# Patient Record
Sex: Male | Born: 1950 | Race: White | Hispanic: No | Marital: Married | Smoking: Never smoker
Health system: Southern US, Community
[De-identification: ages and names within clinical notes are randomized; demographics above are authoritative.]

## PROBLEM LIST (undated history)

## (undated) DIAGNOSIS — F32A Depression, unspecified: Secondary | ICD-10-CM

## (undated) HISTORY — PX: TONSILLECTOMY: SUR1361

## (undated) HISTORY — DX: Depression, unspecified: F32.A

## (undated) HISTORY — PX: OTHER SURGICAL HISTORY: SHX169

---

## 2009-05-05 ENCOUNTER — Emergency Department (HOSPITAL_BASED_OUTPATIENT_CLINIC_OR_DEPARTMENT_OTHER): Admission: EM | Admit: 2009-05-05 | Discharge: 2009-05-05 | Payer: Self-pay | Admitting: Emergency Medicine

## 2009-05-05 ENCOUNTER — Ambulatory Visit: Payer: Self-pay | Admitting: Diagnostic Radiology

## 2011-03-04 IMAGING — CR DG CHEST 2V
2 series · 2 of 2 positions shown · non-contrast
Comparison: None

CLINICAL DATA: Cough.

CHEST - 2 VIEW

[w chest pa]
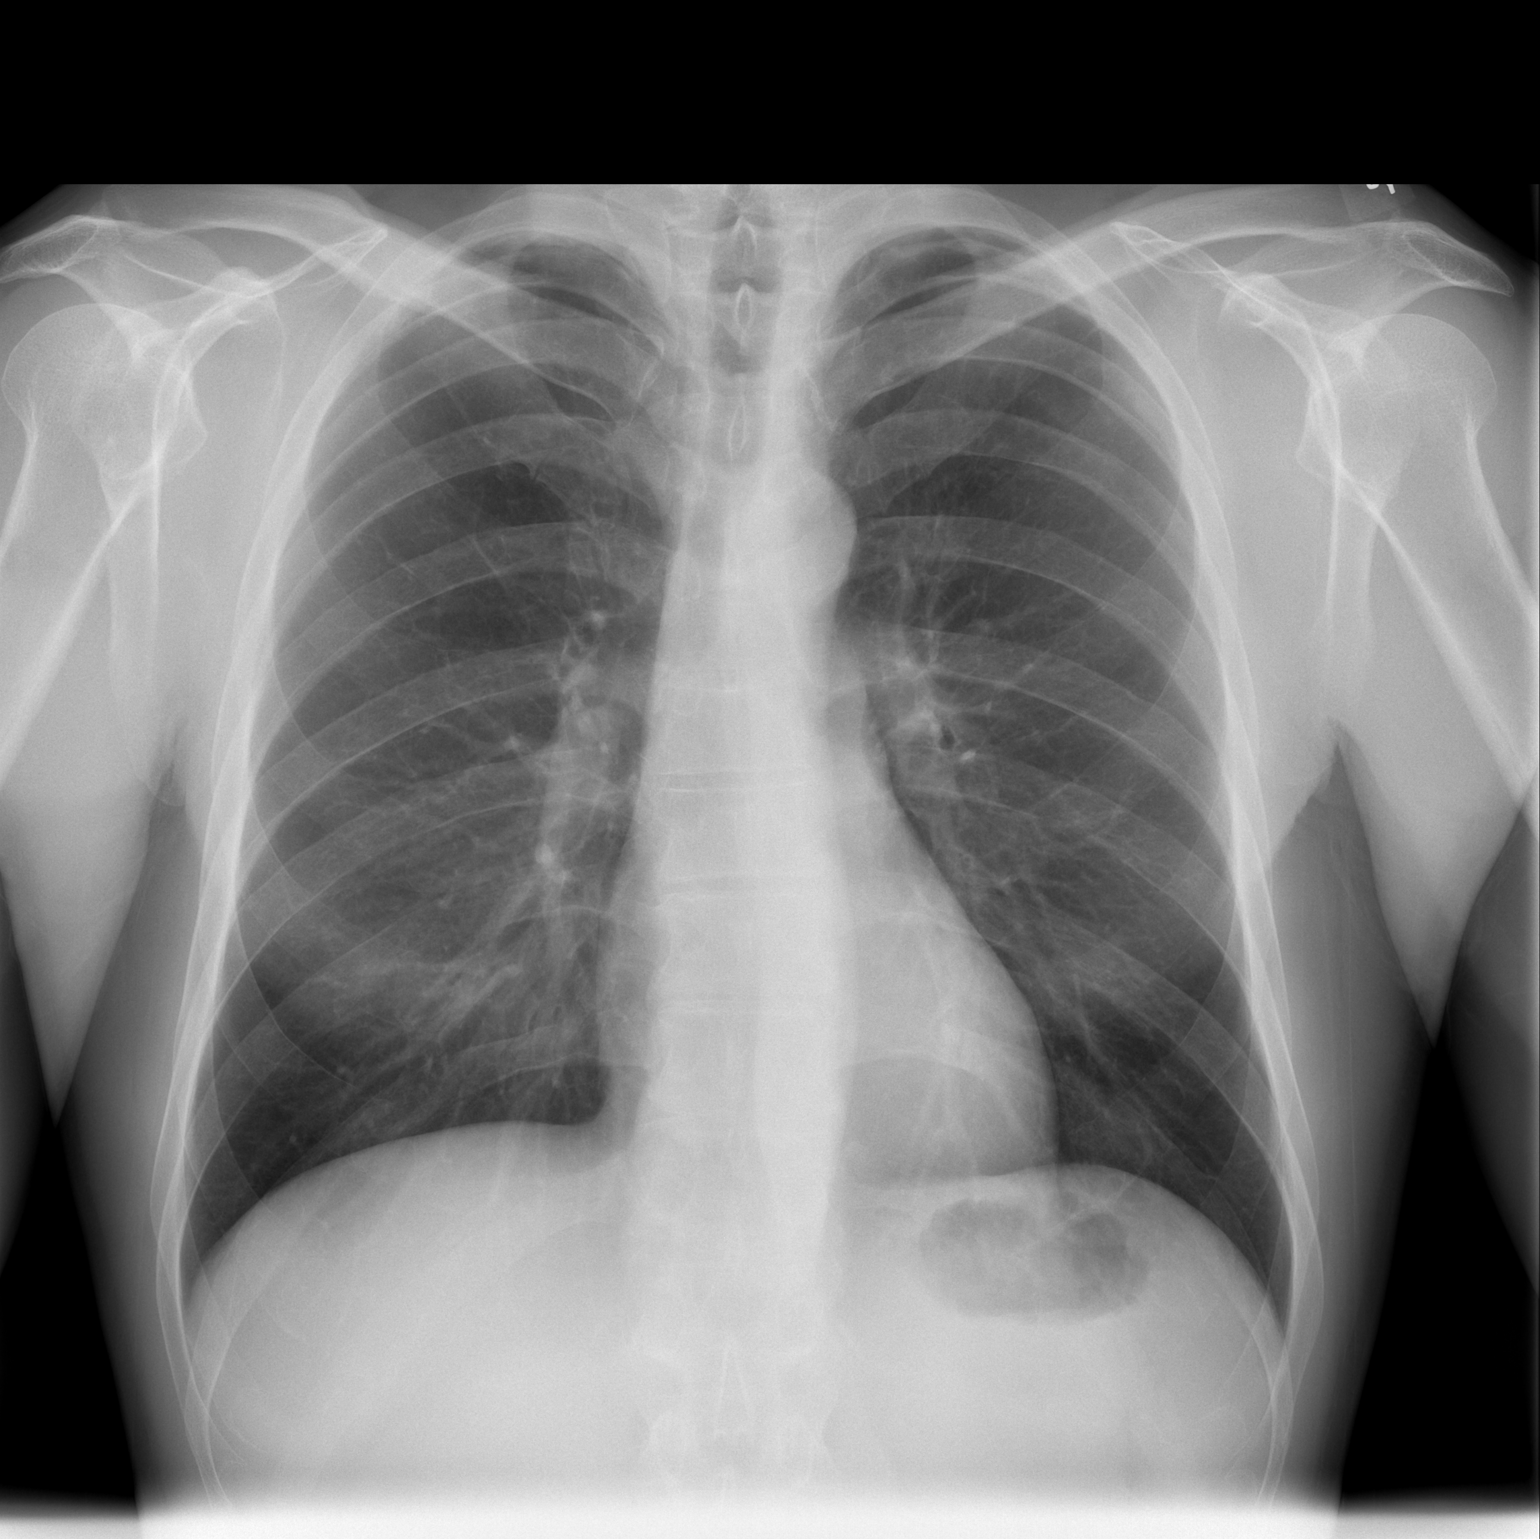

[w chest lat]
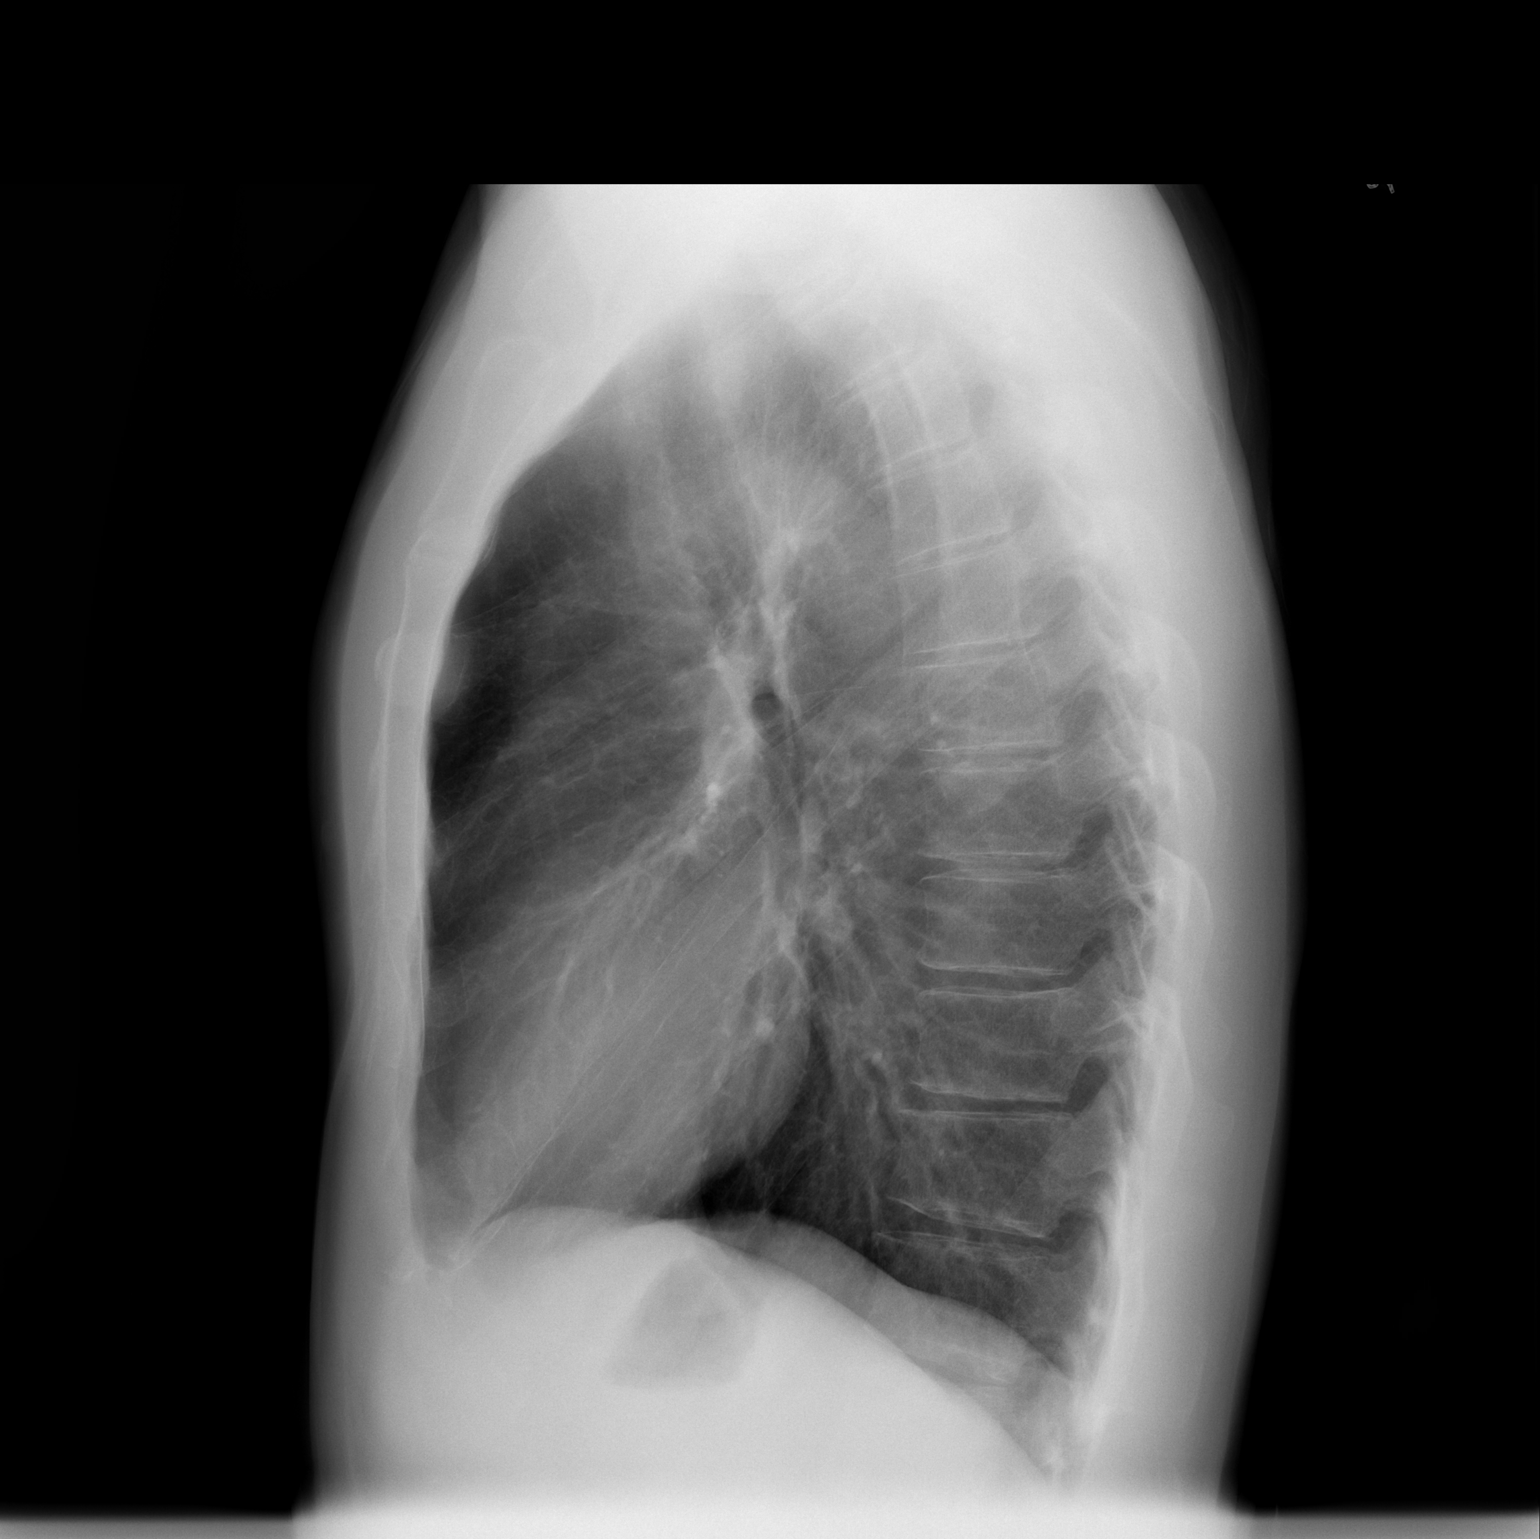

[2 of 2 positions shown; findings below may reference images not displayed]

FINDINGS: Cardiac and mediastinal contours appear normal.

The lungs appear clear.

No pleural effusion is identified.
IMPRESSION: No significant abnormality identified.

## 2015-09-21 DIAGNOSIS — I6521 Occlusion and stenosis of right carotid artery: Secondary | ICD-10-CM | POA: Insufficient documentation

## 2015-09-21 DIAGNOSIS — F325 Major depressive disorder, single episode, in full remission: Secondary | ICD-10-CM | POA: Insufficient documentation

## 2015-09-21 DIAGNOSIS — N138 Other obstructive and reflux uropathy: Secondary | ICD-10-CM | POA: Insufficient documentation

## 2015-09-21 DIAGNOSIS — E785 Hyperlipidemia, unspecified: Secondary | ICD-10-CM | POA: Insufficient documentation

## 2015-09-21 DIAGNOSIS — N401 Enlarged prostate with lower urinary tract symptoms: Secondary | ICD-10-CM | POA: Insufficient documentation

## 2015-09-21 HISTORY — DX: Occlusion and stenosis of right carotid artery: I65.21

## 2015-09-21 HISTORY — DX: Major depressive disorder, single episode, in full remission: F32.5

## 2015-09-21 HISTORY — DX: Hyperlipidemia, unspecified: E78.5

## 2015-09-21 HISTORY — DX: Benign prostatic hyperplasia with lower urinary tract symptoms: N13.8

## 2017-11-10 DIAGNOSIS — I340 Nonrheumatic mitral (valve) insufficiency: Secondary | ICD-10-CM | POA: Insufficient documentation

## 2017-11-10 HISTORY — DX: Nonrheumatic mitral (valve) insufficiency: I34.0

## 2019-03-25 ENCOUNTER — Ambulatory Visit (INDEPENDENT_AMBULATORY_CARE_PROVIDER_SITE_OTHER): Payer: Medicare Other | Admitting: Psychiatry

## 2019-03-25 ENCOUNTER — Encounter: Payer: Self-pay | Admitting: Psychiatry

## 2019-03-25 VITALS — Ht 70.0 in | Wt 170.0 lb

## 2019-03-25 DIAGNOSIS — F339 Major depressive disorder, recurrent, unspecified: Secondary | ICD-10-CM | POA: Diagnosis not present

## 2019-03-25 DIAGNOSIS — F419 Anxiety disorder, unspecified: Secondary | ICD-10-CM | POA: Diagnosis not present

## 2019-03-25 NOTE — Progress Notes (Signed)
Virtual Visit via Telephone Note  I connected with Robert Lee on 03/25/19 at  9:00 AM EST by telephone and verified that I am speaking with the correct person using two identifiers.  Location: Patient: [name]: Home   I discussed the limitations, risks, security and privacy concerns of performing an evaluation and management service by telephone and the availability of in person appointments. I also discussed with the patient that there may be a patient responsible charge related to this service. The patient expressed understanding and agreed to proceed.  I discussed the assessment and treatment plan with the patient. The patient was provided an opportunity to ask questions and all were answered. The patient agreed with the plan and demonstrated an understanding of the instructions.   The patient was advised to call back or seek an in-person evaluation if the symptoms worsen or if the condition fails to improve as anticipated.  I provided 70 minutes of non-face-to-face time during this encounter.   Robert Lee, PMHNP   [facility] MD/PA/NP Initial Note  03/26/2019 8:33 AM Robert Lee  MRN:  [id number]  Chief Complaint:  Chief Complaint    Depression; Insomnia      HPI: Pt is a 68 yo male being seen for initial evaluation for depression and anxiety. Pt reports some recent recurrence of depressive s/s after depression has been in partial remission for approximately 10 years. He reports that about 11 years ago, "I woke up one morning and literally could not get out of bed." He then sought tx from his PCP and was referred to Dr. Delanna AhmadiAlex Lee for psychiatric management. He was tx'd for depression. Pt reports then identified that he has likely has had depression throughout his lifetime. His psychiatrist started him on Wellbutrin XL and this was helpful for his depression and ADD. Pt reports, "I never felt better in my life." Pt reports that he then began to  understand responses to events in the past and his reactions.   He reports that he had severe depression with very low energy and motivation. He reports that he was socially withdrawn and had significantly lower appetite. He reports that others noticed a significant change and bank tellers and barber asked him what was wrong.   He reports that he and his wife have noticed some recent worsening in depressive s/s. About a month ago he had 4-5 consecutive days of depressive s/s with low energy and motivation, decreased appetite, and was more withdrawn. He was sleeping significantly more and would sleep for 11-12 hours. He reports that when he is depressed he loses interest in things that he is typically passionate about and thinks about giving up things he enjoys- downsizing home, retiring, etc which is the opposite of his usual desires. He reports that he has increased anxiety with worsening depression. Will experience some worry "even though there is nothing for me to worry about." Denies SI.   He reports that since those days he has felt "wonderful" and not had any further depressive s/s.   He reports that he likes things very neat and clean. He reports checking that the house is secure every night, particularly after someone tried to enter their home. Occasionally double checking coffee pot, etc. He reports that he is "punctual to a fault."   Reports that he had once incident where he thought he was having an MI and was seen in the ER and then referred to a cardiologist and work-up was negative for cardiac s/s  and was told that he had costochondritis. Recently experienced similar s/s when he had 4-5 days of depressive s/s.   Denies any past manic s/s. He denies any periods of decreased need for sleep. Denies periods of elevated mood.   Denies AH or VH. Denies paranoia.   Reports that he had a head injury in high school and had surgery.  Born and raised in [place]. Has one older brother who now lives in  [place]. He reports, "we were a very dysfunctional family." Reports that father owned and operated saloons and was a heavy drinker. Father worked long hours. Reports that mother was a full-time Materials engineer. Mother hospitalized 2-3 times for psychiatric reasons. Reports that mother was "an incredible mother" and made sure pt and his brother were well cared for. Married x 42 years. Has a son who is 27 yo and married and has 2 grandchildren, that are 34 and college aged. Has a daughter that lives in [place] and is married and has a Sport and exercise psychologist. 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 Enjoys outdoor activities, horseback riding, and yard work. He reports that his faith is important to him.   Past Psychiatric Medication Trials: Wellbutrin XL- Significant improvement Reports taking another anti-depressant that was not effective and did not have any side effects  Visit Diagnosis:    ICD-10-CM   1. Major depression, recurrent, chronic (HCC)  F33.9   2. Anxiety disorder, unspecified type  F41.9     Past Psychiatric History: Saw Dr. Angela Lee starting 11 years ago. PCP is Dr. Sallyanne Lee.   Past Medical History: History reviewed. No pertinent past medical history.  Past Surgical History:  Procedure Laterality Date  . TONSILLECTOMY      Family Psychiatric History: Mother had depression.   Family History:  Family History  Problem Relation Age of Onset  . Depression Mother   . Stroke Father   . ADD / ADHD Son   . OCD Daughter     Social History:  Social History   Socioeconomic History  . Marital status: Married    Spouse name: Not on file  . Number of children: Not on file  . Years of education: Not on file  . Highest education level: Not on file  Occupational History  . Not on file  Tobacco Use  . Smoking status: Never Smoker  . Smokeless tobacco: Never Used  Substance and Sexual Activity  . Alcohol use: Not Currently    Comment: No use in over 20-30 years  . Drug use:  Never  . Sexual activity: Not on file  Other Topics Concern  . Not on file  Social History Narrative  . Not on file   Social Determinants of Health   Financial Resource Strain:   . Difficulty of Paying Living Expenses: Not on file  Food Insecurity:   . Worried About Charity fundraiser in the Last Year: Not on file  . Ran Out of Food in the Last Year: Not on file  Transportation Needs:   . Lack of Transportation (Medical): Not on file  . Lack of Transportation (Non-Medical): Not on file  Physical Activity:   . Days of Exercise per Week: Not on file  . Minutes of Exercise per Session: Not on file  Stress:   . Feeling of Stress : Not on file  Social Connections:   . Frequency of Communication with Friends and Family: Not on file  . Frequency of Social Gatherings with Friends and Family: Not  on file  . Attends Religious Services: Not on file  . Active Member of Clubs or Organizations: Not on file  . Attends Banker Meetings: Not on file  . Marital Status: Not on file    Allergies: No Known Allergies  Metabolic Disorder Labs: No results found for: HGBA1C, MPG No results found for: PROLACTIN No results found for: CHOL, TRIG, HDL, CHOLHDL, VLDL, LDLCALC No results found for: TSH  Therapeutic Level Labs: No results found for: LITHIUM No results found for: VALPROATE No components found for:  CBMZ  Current Medications: Current Outpatient Medications  Medication Sig Dispense Refill  . buPROPion (WELLBUTRIN XL) 300 MG 24 hr tablet TAKE 1 TABLET BY MOUTH  DAILY    . rosuvastatin (CRESTOR) 10 MG tablet Take by mouth.    Marland Kitchen aspirin 81 MG EC tablet Take by mouth.    . Cholecalciferol 25 MCG (1000 UT) tablet Take by mouth.    . Omega-3 1000 MG CAPS Take by mouth.     No current facility-administered medications for this visit.    Medication Side Effects: none  Orders placed this visit:  No orders of the defined types were placed in this  encounter.   Psychiatric Specialty Exam:  Review of Systems  Constitutional: Negative.   HENT: Negative.   Eyes: Negative.   Respiratory: Negative.   Cardiovascular: Negative.   Gastrointestinal: Negative.   Endocrine: Negative.   Genitourinary: Negative.   Musculoskeletal: Positive for back pain.       Reports back pain with prolonged periods of sitting  Skin: Negative.   Allergic/Immunologic: Negative.   Neurological: Negative.   Hematological: Negative.   Psychiatric/Behavioral:       Please refer to HPI    Height  (1.778 m), weight 170 lb (77.1 kg).Body mass index is 24.39 kg/m.  General Appearance: Unable to Assess  Eye Contact:  Unable to Assess  Speech:  Clear and Coherent, Normal Rate and Talkative  Volume:  Normal  Mood:  Euthymic  Affect:  Unable to Assess  Thought Process:  Coherent, Linear and Descriptions of Associations: Intact  Orientation:  Full (Time, Place, and Person)  Thought Content: Logical and Hallucinations: None   Suicidal Thoughts:  No  Homicidal Thoughts:  No  Memory:  WNL  Judgement:  Intact  Insight:  Good  Psychomotor Activity:  Normal  Concentration:  Concentration: Good and Attention Span: Good  Recall:  Good  Fund of Knowledge: Good  Language: Good  Assets:  Communication Skills Desire for Improvement Resilience Social Support  ADL's:  Intact  Cognition: WNL  Prognosis:  Good   Receiving Psychotherapy: No   Treatment Plan/Recommendations: Pt seen for 70 minutes and greater than 50% of session spent counseling pt and coordination of care re: depression and possible treatment options, including potential benefits, risks, and side effects of increasing Wellbutrin XL to 450 mg po qd. Will request records from PCP re: past medication trials since pt is unable to recall past med trials. Answered pt's questions re: pharmacogenetic testing and discussed potential benefits of pharmacogenetic testing. Pt consents to pharmacogenetic  testing and scheduled nurse visit for pt to collect saliva sample. Discussed continuing current medication and following closely for any s/s of worsening depressive s/s while awaiting results of pharmacogenetic testing and past medical records. Pt to f/u in 4 weeks or sooner if clinically indicated. Patient advised to contact office with any questions, adverse effects, or acute worsening in signs and symptoms.   Robert Chiquito,  PMHNP

## 2019-04-29 ENCOUNTER — Encounter: Payer: Self-pay | Admitting: Psychiatry

## 2019-04-29 ENCOUNTER — Ambulatory Visit (INDEPENDENT_AMBULATORY_CARE_PROVIDER_SITE_OTHER): Payer: Medicare Other | Admitting: Psychiatry

## 2019-04-29 DIAGNOSIS — F339 Major depressive disorder, recurrent, unspecified: Secondary | ICD-10-CM | POA: Diagnosis not present

## 2019-04-29 DIAGNOSIS — F419 Anxiety disorder, unspecified: Secondary | ICD-10-CM

## 2019-04-29 NOTE — Progress Notes (Signed)
Robert Lee [id number] [birth year] 69 y.o.  Virtual Visit via Telephone Note  I connected with pt on 04/29/19 at  8:30 AM EST by telephone and verified that I am speaking with the correct person using two identifiers.   I discussed the limitations, risks, security and privacy concerns of performing an evaluation and management service by telephone and the availability of in person appointments. I also discussed with the patient that there may be a patient responsible charge related to this service. The patient expressed understanding and agreed to proceed.   I discussed the assessment and treatment plan with the patient. The patient was provided an opportunity to ask questions and all were answered. The patient agreed with the plan and demonstrated an understanding of the instructions.   The patient was advised to call back or seek an in-person evaluation if the symptoms worsen or if the condition fails to improve as anticipated.  I provided 40 minutes of non-face-to-face time during this encounter.  The patient was located at home.  The provider was located at [facility].   Corie Chiquito, PMHNP   Subjective:   Patient ID:  Robert Lee is a 69 y.o. (DOB Aug 17, 1950) male.  Chief Complaint:  Chief Complaint  Patient presents with  . Follow-up    h/o Depression and anxiety.     HPI Robert Lee presents for follow-up of depression. He reports that he has been doing well. He denies any recent depression s/s. He reports that his mood has been stable. He reports that his appetite has been good. He reports that he has been sleeping well with the exception of when he is traveling. He reports that his motivation and energy have been good overall. He reports that his motivation is lower for exercise. He reports that his concentration is consistent with his baseline. He reports that he tends to hyper-focus with his work and then have difficulty with concentration in other  areas. Denies SI.   He reports that last depressive episode was what he would consider severe. He reports that during that period he was less busy during the pandemic and that he may have been having more anxious thoughts. He notices that idle time may exacerbate his mood s/s.   Had a nice visit from daughter and grandson and then went on 4-5 day business trip.   Past Psychiatric Medication Trials: Wellbutrin XL- Significant improvement Reports taking another anti-depressant that was not effective and did not have any side effects  Review of Systems:  Review of Systems  Constitutional: Negative.   HENT: Negative.   Eyes: Negative.   Respiratory: Negative.   Cardiovascular: Negative.   Gastrointestinal: Negative.   Endocrine: Negative.   Genitourinary: Negative.   Musculoskeletal: Positive for back pain.       Reports back pain with prolonged periods of sitting  Skin: Negative.   Allergic/Immunologic: Negative.   Neurological: Negative.   Hematological: Negative.   Psychiatric/Behavioral:       Please refer to HPI    Medications: I have reviewed the patient's current medications.  Current Outpatient Medications  Medication Sig Dispense Refill  . aspirin 81 MG EC tablet Take by mouth.    Marland Kitchen buPROPion (WELLBUTRIN XL) 300 MG 24 hr tablet TAKE 1 TABLET BY MOUTH  DAILY    . Cholecalciferol 25 MCG (1000 UT) tablet Take by mouth.    . Omega-3 1000 MG CAPS Take by mouth.    . rosuvastatin (CRESTOR) 10 MG tablet Take by  mouth.     No current facility-administered medications for this visit.    Medication Side Effects: None  Allergies: No Known Allergies  History reviewed. No pertinent past medical history.  Family History  Problem Relation Age of Onset  . Depression Mother   . Stroke Father   . ADD / ADHD Son   . OCD Daughter     Social History   Socioeconomic History  . Marital status: Married    Spouse name: Not on file  . Number of children: Not on file  . Years  of education: Not on file  . Highest education level: Not on file  Occupational History  . Not on file  Tobacco Use  . Smoking status: Never Smoker  . Smokeless tobacco: Never Used  Substance and Sexual Activity  . Alcohol use: Not Currently    Comment: No use in over 20-30 years  . Drug use: Never  . Sexual activity: Not on file  Other Topics Concern  . Not on file  Social History Narrative  . Not on file   Social Determinants of Health   Financial Resource Strain:   . Difficulty of Paying Living Expenses: Not on file  Food Insecurity:   . Worried About Programme researcher, broadcasting/film/video in the Last Year: Not on file  . Ran Out of Food in the Last Year: Not on file  Transportation Needs:   . Lack of Transportation (Medical): Not on file  . Lack of Transportation (Non-Medical): Not on file  Physical Activity:   . Days of Exercise per Week: Not on file  . Minutes of Exercise per Session: Not on file  Stress:   . Feeling of Stress : Not on file  Social Connections:   . Frequency of Communication with Friends and Family: Not on file  . Frequency of Social Gatherings with Friends and Family: Not on file  . Attends Religious Services: Not on file  . Active Member of Clubs or Organizations: Not on file  . Attends Banker Meetings: Not on file  . Marital Status: Not on file  Intimate Partner Violence:   . Fear of Current or Ex-Partner: Not on file  . Emotionally Abused: Not on file  . Physically Abused: Not on file  . Sexually Abused: Not on file    Past Medical History, Surgical history, Social history, and Family history were reviewed and updated as appropriate.   Please see review of systems for further details on the patient's review from today.   Objective:   Physical Exam:  There were no vitals taken for this visit.  Physical Exam Neurological:     Mental Status: He is alert and oriented to person, place, and time.     Cranial Nerves: No dysarthria.   Psychiatric:        Attention and Perception: Attention and perception normal.        Mood and Affect: Mood normal.        Speech: Speech normal.        Behavior: Behavior is cooperative.        Thought Content: Thought content normal. Thought content is not paranoid or delusional. Thought content does not include homicidal or suicidal ideation. Thought content does not include homicidal or suicidal plan.        Cognition and Memory: Cognition and memory normal.        Judgment: Judgment normal.     Comments: Insight intact     Lab Review:  No results found for: NA, K, CL, CO2, GLUCOSE, BUN, CREATININE, CALCIUM, PROT, ALBUMIN, AST, ALT, ALKPHOS, BILITOT, GFRNONAA, GFRAA  No results found for: WBC, RBC, HGB, HCT, PLT, MCV, MCH, MCHC, RDW, LYMPHSABS, MONOABS, EOSABS, BASOSABS  No results found for: POCLITH, LITHIUM   No results found for: PHENYTOIN, PHENOBARB, VALPROATE, CBMZ   .res Assessment: Plan:   Patient seen for 40 minutes and time spent reviewing results of pharmacogenetics testing and discussing possible implications for treatment.  Discussed that testing indicates that he may be more responsive to a medication that does not rely on the serotonin transporter gene, and therefore may respond well to an SNRI such as Cymbalta.  Discussed potential benefits, risks, and side effects of Cymbalta.  Discussed option of starting Cymbalta at this time or continuing Wellbutrin XL since he has not had any subsequent depressive episodes, and then considering adding Cymbalta if he experienced a recurrence of depressive signs and symptoms.  Patient reports that he would prefer to continue Wellbutrin XL at this time, and will contact provider if he notices any recurrence of depressive signs and symptoms. Will follow-up in 3 months. Patient advised to contact office with any questions, adverse effects, or acute worsening in signs and symptoms.  Robert Lee was seen today for follow-up.  Diagnoses and  all orders for this visit:  Major depression, recurrent, chronic (HCC)  Anxiety disorder, unspecified type    Please see After Visit Summary for patient specific instructions.  No future appointments.  No orders of the defined types were placed in this encounter.     -------------------------------

## 2019-08-25 ENCOUNTER — Telehealth: Payer: Self-pay | Admitting: Psychiatry

## 2019-08-25 DIAGNOSIS — F419 Anxiety disorder, unspecified: Secondary | ICD-10-CM

## 2019-08-25 DIAGNOSIS — F339 Major depressive disorder, recurrent, unspecified: Secondary | ICD-10-CM

## 2019-08-25 MED ORDER — DULOXETINE HCL 30 MG PO CPEP: ORAL_CAPSULE | ORAL | 0 refills | Status: DC

## 2019-08-25 MED ORDER — DULOXETINE HCL 60 MG PO CPEP: 60.0000 mg | ORAL_CAPSULE | Freq: Every day | ORAL | 0 refills | Status: DC

## 2019-08-25 NOTE — Telephone Encounter (Addendum)
He reports, "today I am doing great, but over the last few weeks I have been struggling." He has been experiencing some anxiety and depression. He reports that "blue days are becoming more frequent." He reports that depressive s/s have been more intense. He reports feeling nervous and worried. He has been thinking about everything in his home that needs to be tended to. He has started re-living his past regrets. Energy and motivation have been low. He reports that he has had difficulty doing smaller tasks that he normally would not have difficulty completing. He reports that he goes to sleep around 9 pm and sleeping until 6 am. Has gone to bed around 7 pm. He reports that appetite has been decreased and has been eating only one meal a day. Denies SI.   Plan: Start cymbalta 30 mg po q am x 1 week, then increase to 60 mg po q am for depression and anxiety. Discussed potential benefits, risks, and side effects of Cymbalta and pt agrees to trial. Pt to f/u in 5 weeks or sooner if clinically indicated. Patient advised to contact office with any questions, adverse effects, or acute worsening in signs and symptoms.

## 2019-08-25 NOTE — Telephone Encounter (Signed)
Pt is requesting a call from [employer]. He did give the reason, he would just like a call back on # [phone]

## 2019-09-26 ENCOUNTER — Encounter: Payer: Self-pay | Admitting: Psychiatry

## 2019-09-26 ENCOUNTER — Ambulatory Visit (INDEPENDENT_AMBULATORY_CARE_PROVIDER_SITE_OTHER): Payer: Medicare Other | Admitting: Psychiatry

## 2019-09-26 ENCOUNTER — Other Ambulatory Visit: Payer: Self-pay

## 2019-09-26 DIAGNOSIS — F419 Anxiety disorder, unspecified: Secondary | ICD-10-CM | POA: Diagnosis not present

## 2019-09-26 DIAGNOSIS — F339 Major depressive disorder, recurrent, unspecified: Secondary | ICD-10-CM | POA: Diagnosis not present

## 2019-09-26 MED ORDER — DULOXETINE HCL 60 MG PO CPEP: 60.0000 mg | ORAL_CAPSULE | Freq: Every day | ORAL | 1 refills | Status: DC

## 2019-09-26 NOTE — Progress Notes (Signed)
Robert Lee [id number] 1950-07-06 69 y.o.  Subjective:   Patient ID:  Robert Lee is a 69 y.o. (DOB 1951/02/25) male.  Chief Complaint:  Chief Complaint  Patient presents with  . Follow-up    Depression, anxiety    HPI Robert Lee presents to the office today for follow-up of depression and anxiety.  He reports that he has been "wonderful" after starting Cymbalta- "back to my old self, have a lot of energy, want to be around people, not anxious." Reports less rumination. Denies current depression. Denies irritability. Concentration has been adequate. Sleeping well. Estimates sleeping about 8-10 hours a night. He reports that his motivation has been good. Denies impulsive or risky behavior. Denies excessive energy or increased goal-directed activity. Denies anhedonia. Denies SI.   He reports that he is traveling to [place] weekly on business. 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 Works out of his home and is self-employed.   He reports that his wife reports that he has been doing well. Reports that wife is supportive.   Past Psychiatric Medication Trials: Wellbutrin XL- Significant improvement Reports taking another anti-depressant that was not effective and did not have any side effects  Review of Systems:  Review of Systems  HENT: Positive for hearing loss.   Musculoskeletal: Positive for arthralgias. Negative for gait problem.  Neurological: Negative for tremors.  Psychiatric/Behavioral:       Please refer to HPI    Medications: I have reviewed the patient's current medications.  Current Outpatient Medications  Medication Sig Dispense Refill  . aspirin 81 MG EC tablet Take by mouth.    Marland Kitchen buPROPion (WELLBUTRIN XL) 300 MG 24 hr tablet TAKE 1 TABLET BY MOUTH  DAILY    . Cholecalciferol 25 MCG (1000 UT) tablet Take by mouth.    . diphenhydrAMINE (SIMPLY SLEEP) 25 MG tablet Take 25 mg by mouth at bedtime as  needed for sleep.    . DULoxetine (CYMBALTA) 60 MG capsule Take 1 capsule (60 mg total) by mouth daily. 90 capsule 1  . Omega-3 1000 MG CAPS Take by mouth.    . rosuvastatin (CRESTOR) 10 MG tablet Take by mouth.     No current facility-administered medications for this visit.    Medication Side Effects: None  Allergies: No Known Allergies  History reviewed. No pertinent past medical history.  Family History  Problem Relation Age of Onset  . Depression Mother   . Stroke Father   . ADD / ADHD Son   . OCD Daughter     Social History   Socioeconomic History  . Marital status: Married    Spouse name: Not on file  . Number of children: Not on file  . Years of education: Not on file  . Highest education level: Not on file  Occupational History  . Not on file  Tobacco Use  . Smoking status: Never Smoker  . Smokeless tobacco: Never Used  Substance and Sexual Activity  . Alcohol use: Not Currently    Comment: No use in over 20-30 years  . Drug use: Never  . Sexual activity: Not on file  Other Topics Concern  . Not on file  Social History Narrative  . Not on file   Social Determinants of Health   Financial Resource Strain:   . Difficulty of Paying Living Expenses:   Food Insecurity:   . Worried About Programme researcher, broadcasting/film/video in the Last  Year:   . Ran Out of Food in the Last Year:   Transportation Needs:   . Film/video editor (Medical):   Marland Kitchen Lack of Transportation (Non-Medical):   Physical Activity:   . Days of Exercise per Week:   . Minutes of Exercise per Session:   Stress:   . Feeling of Stress :   Social Connections:   . Frequency of Communication with Friends and Family:   . Frequency of Social Gatherings with Friends and Family:   . Attends Religious Services:   . Active Member of Clubs or Organizations:   . Attends Archivist Meetings:   Marland Kitchen Marital Status:   Intimate Partner Violence:   . Fear of Current or Ex-Partner:   . Emotionally Abused:    Marland Kitchen Physically Abused:   . Sexually Abused:     Past Medical History, Surgical history, Social history, and Family history were reviewed and updated as appropriate.   Please see review of systems for further details on the patient's review from today.   Objective:   Physical Exam:  BP (!) 143/69   Pulse 74   Wt 170 lb (77.1 kg)   BMI 24.39 kg/m   Physical Exam Constitutional:      General: He is not in acute distress. Musculoskeletal:        General: No deformity.  Neurological:     Mental Status: He is alert and oriented to person, place, and time.     Coordination: Coordination normal.  Psychiatric:        Attention and Perception: Attention and perception normal. He does not perceive auditory or visual hallucinations.        Mood and Affect: Mood normal. Mood is not anxious or depressed. Affect is not labile, blunt, angry or inappropriate.        Speech: Speech normal.        Behavior: Behavior normal.        Thought Content: Thought content normal. Thought content is not paranoid or delusional. Thought content does not include homicidal or suicidal ideation. Thought content does not include homicidal or suicidal plan.        Cognition and Memory: Cognition and memory normal.        Judgment: Judgment normal.     Comments: Insight intact     Lab Review:  No results found for: NA, K, CL, CO2, GLUCOSE, BUN, CREATININE, CALCIUM, PROT, ALBUMIN, AST, ALT, ALKPHOS, BILITOT, GFRNONAA, GFRAA  No results found for: WBC, RBC, HGB, HCT, PLT, MCV, MCH, MCHC, RDW, LYMPHSABS, MONOABS, EOSABS, BASOSABS  No results found for: POCLITH, LITHIUM   No results found for: PHENYTOIN, PHENOBARB, VALPROATE, CBMZ   .res Assessment: Plan:   Continue Cymbalta 60 mg daily for anxiety and depression since patient reports that mood and anxiety have improved with Cymbalta. Recommend continuing Wellbutrin XL for depression.  Patient reports that he does not need a refill at this time. Patient  follow-up in 6 months or sooner if clinically indicated. Patient advised to contact office with any questions, adverse effects, or acute worsening in signs and symptoms.  [name] was seen today for follow-up.  Diagnoses and all orders for this visit:  Major depression, recurrent, chronic (HCC) -     DULoxetine (CYMBALTA) 60 MG capsule; Take 1 capsule (60 mg total) by mouth daily.  Anxiety disorder, unspecified type -     DULoxetine (CYMBALTA) 60 MG capsule; Take 1 capsule (60 mg total) by mouth daily.  Please see After Visit Summary for patient specific instructions.  Future Appointments  Date Time Provider Department Center  03/27/2020  8:30 AM Corie Chiquito, PMHNP [facility] None    No orders of the defined types were placed in this encounter.   -------------------------------

## 2019-09-29 DIAGNOSIS — H906 Mixed conductive and sensorineural hearing loss, bilateral: Secondary | ICD-10-CM | POA: Insufficient documentation

## 2019-09-29 HISTORY — DX: Mixed conductive and sensorineural hearing loss, bilateral: H90.6

## 2020-03-05 ENCOUNTER — Ambulatory Visit (INDEPENDENT_AMBULATORY_CARE_PROVIDER_SITE_OTHER): Payer: Medicare Other | Admitting: Psychiatry

## 2020-03-05 ENCOUNTER — Other Ambulatory Visit: Payer: Self-pay

## 2020-03-05 ENCOUNTER — Encounter: Payer: Self-pay | Admitting: Psychiatry

## 2020-03-05 DIAGNOSIS — F419 Anxiety disorder, unspecified: Secondary | ICD-10-CM

## 2020-03-05 DIAGNOSIS — F339 Major depressive disorder, recurrent, unspecified: Secondary | ICD-10-CM | POA: Diagnosis not present

## 2020-03-05 NOTE — Progress Notes (Signed)
Robert Lee [id number] 29-Sep-1950 69 y.o.  Subjective:   Patient ID:  Robert Lee is a 69 y.o. (DOB 28-Mar-1951) male.  Chief Complaint:  Chief Complaint  Patient presents with   Follow-up    h/o depression and anxiety    HPI Robert Lee presents to the office today for follow-up of depression and anxiety. "I have no complaints." He reports that he has had "weird dreams" some nights. He reports that dreams can disrupt sleep at times. He reports that about twice a month he has very vivid dreams that cause multiple awakenings and feels that he does not rest. He reports that he had vivid dreams throughout the night last night. He reports that he went to be earlier due to being tired after having company. He reports that yesterday he forgot to take medication yesterday. Had difficulty falling asleep last night. He notices sleep is improved when he has been more physically active. He describes mood as "great." Denies depressed mood or irritability. Denies anxiety. He reports that he normally does not have difficulty falling or staying asleep. He typically will take a 30-minute nap during the day. He reports that he typically eats throughout the day. He reports that appetite has been slightly less and notices on a few occasions he has not "over-indulged" during some situations that he normally would. He has been trying to hydrate more. Energy and motivation have been good. Concentration has been adequate. Denies social isolation. Denies SI.   Working out of his home. Reports that work is going well. He enjoys his work.   Has a 40 month old grandson. Has 2 other older grandchildren.   Stopped taking Simply Sleep   Past Psychiatric Medication Trials: Wellbutrin XL- Significant improvement Cymbalta Reports taking another anti-depressant that was not effective and did not have any side effects    Review of Systems:  Review of Systems  Constitutional: Negative for diaphoresis.   Musculoskeletal: Positive for arthralgias. Negative for gait problem.  Neurological: Negative for tremors.  Psychiatric/Behavioral:       Please refer to HPI    Medications: I have reviewed the patient's current medications.  Current Outpatient Medications  Medication Sig Dispense Refill   aspirin 81 MG EC tablet Take by mouth.     buPROPion (WELLBUTRIN XL) 300 MG 24 hr tablet TAKE 1 TABLET BY MOUTH  DAILY     Cholecalciferol 25 MCG (1000 UT) tablet Take by mouth.     DULoxetine (CYMBALTA) 60 MG capsule Take 1 capsule (60 mg total) by mouth daily. 90 capsule 1   Omega-3 1000 MG CAPS Take by mouth.     rosuvastatin (CRESTOR) 10 MG tablet Take by mouth.     valACYclovir (VALTREX) 1000 MG tablet TAKE TWO TABLETS BY MOUTH AT ONSET, THEN TAKE TWO TABLETS 12 HOURS LATER AS DIRECTED     No current facility-administered medications for this visit.    Medication Side Effects: Other: Possible occ vivid dreams  Allergies: No Known Allergies  History reviewed. No pertinent past medical history.  Family History  Problem Relation Age of Onset   Depression Mother    Stroke Father    ADD / ADHD Son    OCD Daughter     Social History   Socioeconomic History   Marital status: Married    Spouse name: Not on file   Number of children: Not on file   Years of education: Not on file   Highest education level: Not on file  Occupational History   Not on file  Tobacco Use   Smoking status: Never Smoker   Smokeless tobacco: Never Used  Substance and Sexual Activity   Alcohol use: Not Currently    Comment: No use in over 20-30 years   Drug use: Never   Sexual activity: Not on file  Other Topics Concern   Not on file  Social History Narrative   Not on file   Social Determinants of Health   Financial Resource Strain:    Difficulty of Paying Living Expenses: Not on file  Food Insecurity:    Worried About Running Out of Food in the Last Year: Not on file    Ran Out of Food in the Last Year: Not on file  Transportation Needs:    Lack of Transportation (Medical): Not on file   Lack of Transportation (Non-Medical): Not on file  Physical Activity:    Days of Exercise per Week: Not on file   Minutes of Exercise per Session: Not on file  Stress:    Feeling of Stress : Not on file  Social Connections:    Frequency of Communication with Friends and Family: Not on file   Frequency of Social Gatherings with Friends and Family: Not on file   Attends Religious Services: Not on file   Active Member of Clubs or Organizations: Not on file   Attends Banker Meetings: Not on file   Marital Status: Not on file  Intimate Partner Violence:    Fear of Current or Ex-Partner: Not on file   Emotionally Abused: Not on file   Physically Abused: Not on file   Sexually Abused: Not on file    Past Medical History, Surgical history, Social history, and Family history were reviewed and updated as appropriate.   Please see review of systems for further details on the patient's review from today.   Objective:   Physical Exam:  There were no vitals taken for this visit.  Physical Exam Constitutional:      General: He is not in acute distress. Musculoskeletal:        General: No deformity.  Neurological:     Mental Status: He is alert and oriented to person, place, and time.     Coordination: Coordination normal.  Psychiatric:        Attention and Perception: Attention and perception normal. He does not perceive auditory or visual hallucinations.        Mood and Affect: Mood normal. Mood is not anxious or depressed. Affect is not labile, blunt, angry or inappropriate.        Speech: Speech normal.        Behavior: Behavior normal.        Thought Content: Thought content normal. Thought content is not paranoid or delusional. Thought content does not include homicidal or suicidal ideation. Thought content does not include homicidal  or suicidal plan.        Cognition and Memory: Cognition and memory normal.        Judgment: Judgment normal.     Comments: Insight intact     Lab Review:  No results found for: NA, K, CL, CO2, GLUCOSE, BUN, CREATININE, CALCIUM, PROT, ALBUMIN, AST, ALT, ALKPHOS, BILITOT, GFRNONAA, GFRAA  No results found for: WBC, RBC, HGB, HCT, PLT, MCV, MCH, MCHC, RDW, LYMPHSABS, MONOABS, EOSABS, BASOSABS  No results found for: POCLITH, LITHIUM   No results found for: PHENYTOIN, PHENOBARB, VALPROATE, CBMZ   .res Assessment: Plan:   Patient seen  for 30 minutes and time spent counseling patient regarding possible medication side effects and strategies to reduce vivid dreams.  Discussed that missed dose yesterday and disruptions in sleep schedule may have contributed to vivid dreams last night.  Discussed that vivid dreams can be a potential side effect with antidepressants and to notify provider if vivid dreams worsen and/o interfere with quality sleep. Continue Cymbalta 60 mg daily for depression and anxiety. Continue Wellbutrin XL 300 mg daily for depression. Patient to follow-up in 6 months or sooner if clinically indicated. Patient advised to contact office with any questions, adverse effects, or acute worsening in signs and symptoms.  Robert Lee was seen today for follow-up.  Diagnoses and all orders for this visit:  Major depression, recurrent, chronic (HCC)  Anxiety disorder, unspecified type     Please see After Visit Summary for patient specific instructions.  No future appointments.  No orders of the defined types were placed in this encounter.   -------------------------------

## 2020-03-27 ENCOUNTER — Ambulatory Visit: Payer: Medicare Other | Admitting: Psychiatry

## 2020-04-13 ENCOUNTER — Telehealth: Payer: Self-pay | Admitting: Psychiatry

## 2020-04-13 DIAGNOSIS — F419 Anxiety disorder, unspecified: Secondary | ICD-10-CM

## 2020-04-13 DIAGNOSIS — F339 Major depressive disorder, recurrent, unspecified: Secondary | ICD-10-CM

## 2020-04-13 MED ORDER — DULOXETINE HCL 60 MG PO CPEP: 60.0000 mg | ORAL_CAPSULE | Freq: Every day | ORAL | 1 refills | Status: DC

## 2020-04-13 MED ORDER — DULOXETINE HCL 60 MG PO CPEP: 60.0000 mg | ORAL_CAPSULE | Freq: Every day | ORAL | 0 refills | Status: DC

## 2020-04-13 NOTE — Telephone Encounter (Signed)
Next appt is 08/31/20. Requesting a refill for Duloxetine called in to [facility] at [phone]. Since [facility] is totally out and it will be 10 days before he gets the medicine in mail, can you please call in #10 pills to [facility] on [address], [place]. Phone# [phone]. This will get him by until he gets it from [place].

## 2020-04-13 NOTE — Telephone Encounter (Signed)
90 day script sent to [facility] and 10 day supply sent to [facility].

## 2020-07-30 ENCOUNTER — Encounter: Payer: Self-pay | Admitting: Psychiatry

## 2020-07-30 ENCOUNTER — Other Ambulatory Visit: Payer: Self-pay

## 2020-07-30 ENCOUNTER — Ambulatory Visit (INDEPENDENT_AMBULATORY_CARE_PROVIDER_SITE_OTHER): Payer: Medicare Other | Admitting: Psychiatry

## 2020-07-30 DIAGNOSIS — F339 Major depressive disorder, recurrent, unspecified: Secondary | ICD-10-CM

## 2020-07-30 DIAGNOSIS — F419 Anxiety disorder, unspecified: Secondary | ICD-10-CM

## 2020-07-30 MED ORDER — DULOXETINE HCL 60 MG PO CPEP: 60.0000 mg | ORAL_CAPSULE | Freq: Every day | ORAL | 1 refills | Status: DC

## 2020-07-30 MED ORDER — BUPROPION HCL ER (XL) 300 MG PO TB24: 1.0000 | ORAL_TABLET | Freq: Every day | ORAL | 1 refills | Status: DC

## 2020-07-30 NOTE — Progress Notes (Signed)
ESTILL LLERENA [id number] 01/15/1951 70 y.o.  Subjective:   Patient ID:  Robert Lee is a 70 y.o. (DOB June 27, 1950) male.  Chief Complaint:  Chief Complaint  Patient presents with  . Follow-up    H/o depression    HPI Robert Lee presents to the office today for follow-up of history of depression. He reports that about a month ago he had 3 consecutive days where he felt "horrible" with depressive s/s to include decreased appetite, very little energy and motivation, sleeps/dreams more with less restorative sleep, and "staring off." He reports that depressive s/s resolved after he withdrew from others.  He and his wife have someone staying with them that is going through a divorce and feeling that systems are "failing" her. He questions if this situation has negatively affected his mood and it occurred around the same time as his depression. He reports that his mood has returned to baseline. Energy and motivation have been good. He reports that he is very self-motivated. Denies concentration impairment. Sleeping well. Appetite has been good. Denies SI.   He reports that he has not been exercising regularly.   He describes himself as a perfectionist and does not want to make mistakes or let people down.   He reports that he had a depressive episode one year ago in late April/early May. Denies any anniversaries of trauma or losses.   He reports that his paternal grandmother would have "spells" where she would sit in a rocking chair and stare.   Past Psychiatric Medication Trials: Wellbutrin XL- Significant improvement Cymbalta Reports taking another anti-depressant that was not effective and did not have any side effects    Review of Systems:  Review of Systems  Gastrointestinal: Negative.   Musculoskeletal: Negative for gait problem.  Neurological: Negative for tremors and headaches.  Psychiatric/Behavioral:       Please refer to HPI    Medications: I have reviewed the  patient's current medications.  Current Outpatient Medications  Medication Sig Dispense Refill  . aspirin 81 MG EC tablet Take by mouth.    . Cholecalciferol 25 MCG (1000 UT) tablet Take by mouth.    . Omega-3 1000 MG CAPS Take by mouth.    . rosuvastatin (CRESTOR) 10 MG tablet Take by mouth.    Marland Kitchen buPROPion (WELLBUTRIN XL) 300 MG 24 hr tablet Take 1 tablet (300 mg total) by mouth daily. 90 tablet 1  . DULoxetine (CYMBALTA) 60 MG capsule Take 1 capsule (60 mg total) by mouth daily. 90 capsule 1  . valACYclovir (VALTREX) 1000 MG tablet TAKE TWO TABLETS BY MOUTH AT ONSET, THEN TAKE TWO TABLETS 12 HOURS LATER AS DIRECTED     No current facility-administered medications for this visit.    Medication Side Effects: None  Allergies: No Known Allergies  History reviewed. No pertinent past medical history.  Past Medical History, Surgical history, Social history, and Family history were reviewed and updated as appropriate.   Please see review of systems for further details on the patient's review from today.   Objective:   Physical Exam:  There were no vitals taken for this visit.  Physical Exam Constitutional:      General: He is not in acute distress. Musculoskeletal:        General: No deformity.  Neurological:     Mental Status: He is alert and oriented to person, place, and time.     Coordination: Coordination normal.  Psychiatric:        Attention and  Perception: Attention and perception normal. He does not perceive auditory or visual hallucinations.        Mood and Affect: Mood normal. Mood is not anxious or depressed. Affect is not labile, blunt, angry or inappropriate.        Speech: Speech normal.        Behavior: Behavior normal.        Thought Content: Thought content normal. Thought content is not paranoid or delusional. Thought content does not include homicidal or suicidal ideation. Thought content does not include homicidal or suicidal plan.        Cognition and  Memory: Cognition and memory normal.        Judgment: Judgment normal.     Comments: Insight intact     Lab Review:  No results found for: NA, K, CL, CO2, GLUCOSE, BUN, CREATININE, CALCIUM, PROT, ALBUMIN, AST, ALT, ALKPHOS, BILITOT, GFRNONAA, GFRAA  No results found for: WBC, RBC, HGB, HCT, PLT, MCV, MCH, MCHC, RDW, LYMPHSABS, MONOABS, EOSABS, BASOSABS  No results found for: POCLITH, LITHIUM   No results found for: PHENYTOIN, PHENOBARB, VALPROATE, CBMZ   .res Assessment: Plan:   Pt seen for 30 minutes and time spent counseling pt regarding cycles of depression. Discussed that episodes of depression are not typical in terms of abrupt onset and sudden resolution and that this type of depression sometimes responds well to very low dose lithium or lamotrigine. Discussed that one of these medications could be started if depressive episodes increase in frequency and/or duration since they are now occurring about once a year and lasting several days. Advised pt to contact office with worsening depression.  Continue Cymbalta 60 mg daily for depression. He asks about increasing Cymbalta to 2 capsules during a depressive episode. Discussed that doses higher than 60 mg are off label. Discussed trying an additional 30 mg if necessary in the future to equal total dose of 90 mg instead of taking two 60 mg capsules.  Continue Wellbutrin XL 300 mg daily for depression.  Pt to follow-up in 6 months or sooner if clinically indicated.  Patient advised to contact office with any questions, adverse effects, or acute worsening in signs and symptoms.  Robert Lee was seen today for follow-up.  Diagnoses and all orders for this visit:  Major depression, recurrent, chronic (HCC) -     DULoxetine (CYMBALTA) 60 MG capsule; Take 1 capsule (60 mg total) by mouth daily. -     buPROPion (WELLBUTRIN XL) 300 MG 24 hr tablet; Take 1 tablet (300 mg total) by mouth daily.  Anxiety disorder, unspecified type -     DULoxetine  (CYMBALTA) 60 MG capsule; Take 1 capsule (60 mg total) by mouth daily.     Please see After Visit Summary for patient specific instructions.  Future Appointments  Date Time Provider Department Center  01/29/2021  8:30 AM Corie Chiquito, PMHNP [facility] None    No orders of the defined types were placed in this encounter.   -------------------------------

## 2020-07-31 ENCOUNTER — Telehealth: Payer: Self-pay | Admitting: Internal Medicine

## 2020-07-31 NOTE — Telephone Encounter (Signed)
Patient would like to become your patient. His pcp has retired. Please advise

## 2020-08-01 NOTE — Telephone Encounter (Signed)
Yes- I think he was referred to [place] by Corie Chiquito.  I will see him

## 2020-08-01 NOTE — Telephone Encounter (Signed)
Lm for patient to call back to schedule.

## 2020-08-11 NOTE — Progress Notes (Addendum)
[facility] [address], [address] [place], [place] [address] [phone]  Date:  08/13/2020   Name:  Robert Lee   DOB:  1950-07-24   MRN:  [id number]  PCP:  Pearline Cables, MD    Chief Complaint: New Patient (Initial Visit) (Previous pcp retired)   History of Present Illness:  Robert Lee is a 70 y.o. very pleasant male patient who presents with the following:  Seen today as a new patient to establish care Referred to see [place] by Corie Chiquito at [facility]  He is still working- he is in the [employer] business Married to [place], they have 2 grown children and 3 grands ages 26, 75, 75 years old  He is from [place], has lived in this area 45 years   No history of CAD He has done a couple of stress tests in the past due to chest pain Turned out to be costochondritis  Most recent stress test approx 2 years ago- he did fine  He was seeing Dr Julius Bowels with [facility] cardiology before he retired   Last colon cancer screening done per [facility], will request this record He would like to do labs today- he is not fasting   He does not drink alcohol or smoke Not getting a lot of exercise right now He is typically enjoying horseback riding-  His horse passed away not long ago- he was 70 years old He does travel quite a bit for his work   His father had a CVA from a carotid- pt has screening on a regular basis, his next ultrasound is coming up  He otherwise has no particular concerns today  Patient Active Problem List   Diagnosis Date Noted  . Family history of carotid artery stenosis 08/13/2020    Past Medical History:  Diagnosis Date  . Depression 11 years ago    Past Surgical History:  Procedure Laterality Date  . OTHER SURGICAL HISTORY  1968-1969   scull depression surgery   . TONSILLECTOMY      Social History   Tobacco Use  . Smoking status: Never Smoker  . Smokeless tobacco: Never Used   Substance Use Topics  . Alcohol use: Not Currently    Comment: No use in over 20-30 years  . Drug use: Never    Family History  Problem Relation Age of Onset  . Depression Mother   . ADD / ADHD Mother   . OCD Mother   . Stroke Father   . ADD / ADHD Son   . OCD Daughter   . Drug abuse Paternal Grandmother     No Known Allergies  Medication list has been reviewed and updated.  Current Outpatient Medications on File Prior to Visit  Medication Sig Dispense Refill  . aspirin 81 MG EC tablet Take by mouth.    Marland Kitchen buPROPion (WELLBUTRIN XL) 300 MG 24 hr tablet Take 1 tablet (300 mg total) by mouth daily. 90 tablet 1  . Cholecalciferol 25 MCG (1000 UT) tablet Take by mouth.    . DULoxetine (CYMBALTA) 60 MG capsule Take 1 capsule (60 mg total) by mouth daily. 90 capsule 1  . Omega 3 1200 MG CAPS Take by mouth.    . rosuvastatin (CRESTOR) 10 MG tablet Take by mouth.    . valACYclovir (VALTREX) 1000 MG tablet TAKE TWO TABLETS BY MOUTH AT ONSET, THEN TAKE TWO TABLETS 12 HOURS LATER AS DIRECTED  No current facility-administered medications on file prior to visit.    Review of Systems:  As per HPI- otherwise negative.   Physical Examination: Vitals:   08/13/20 0917  BP: 132/80  Pulse: 88  Resp: 17  Temp: (!) 97.4 F (36.3 C)  SpO2: 99%   Vitals:   08/13/20 0917  Weight: 173 lb (78.5 kg)  Height: 5\' 10"  (1.778 m)   Body mass index is 24.82 kg/m. Ideal Body Weight: Weight in (lb) to have BMI = 25: 173.9  GEN: no acute distress. HEENT: Atraumatic, Normocephalic.  Ears and Nose: No external deformity. CV: RRR, No M/G/R. No JVD. No thrill. No extra heart sounds. PULM: CTA B, no wheezes, crackles, rhonchi. No retractions. No resp. distress. No accessory muscle use. ABD: S, NT, ND, +BS. No rebound. No HSM. EXTR: No c/c/e PSYCH: Normally interactive. Conversant.    Assessment and Plan: Encounter for medical examination to establish care  Stenosis of carotid artery,  unspecified laterality  Screening for prostate cancer - Plan: PSA  Screening for diabetes mellitus - Plan: Comprehensive metabolic panel, Hemoglobin A1c  Dyslipidemia - Plan: Lipid panel  Screening for deficiency anemia - Plan: CBC  Family history of carotid artery stenosis  Patient today to establish care.  He would also like to have his wife see as well which is fine We discussed his health maintenance, I requested a copy of his most recent colonoscopy report Will plan further follow- up pending labs. Discussed immunizations This visit occurred during the SARS-CoV-2 public health emergency.  Safety protocols were in place, including screening questions prior to the visit, additional usage of staff PPE, and extensive cleaning of exam room while observing appropriate contact time as indicated for disinfecting solutions.    Signed [place], MD  [phone] Dr 08-20-2001 -last colonoscopy  Received labs as below, message to patient Results for orders placed or performed in visit on 08/13/20  CBC  Result Value Ref Range   WBC 5.8 4.0 - 10.5 K/uL   RBC 4.87 4.22 - 5.81 Mil/uL   Platelets 240.0 150.0 - 400.0 K/uL   Hemoglobin 14.9 13.0 - 17.0 g/dL   HCT 10/13/20 93.8 - 18.2 %   MCV 91.3 78.0 - 100.0 fl   MCHC 33.4 30.0 - 36.0 g/dL   RDW 99.3 71.6 - 96.7 %  Comprehensive metabolic panel  Result Value Ref Range   Sodium 139 135 - 145 mEq/L   Potassium 4.5 3.5 - 5.1 mEq/L   Chloride 103 96 - 112 mEq/L   CO2 30 19 - 32 mEq/L   Glucose, Bld 96 70 - 99 mg/dL   BUN 22 6 - 23 mg/dL   Creatinine, Ser 89.3 0.40 - 1.50 mg/dL   Total Bilirubin 0.6 0.2 - 1.2 mg/dL   Alkaline Phosphatase 59 39 - 117 U/L   AST 16 0 - 37 U/L   ALT 18 0 - 53 U/L   Total Protein 6.7 6.0 - 8.3 g/dL   Albumin 4.5 3.5 - 5.2 g/dL   GFR 8.10 (L) 17.51 mL/min   Calcium 9.6 8.4 - 10.5 mg/dL  Hemoglobin >02.58  Result Value Ref Range   Hgb A1c MFr Bld 6.2 4.6 - 6.5 %  Lipid panel  Result Value Ref  Range   Cholesterol 180 0 - 200 mg/dL   Triglycerides N2D 0.0 - 149.0 mg/dL   HDL 782.4 23.53 mg/dL   VLDL >61.44 0.0 - 31.5 mg/dL   LDL Cholesterol 94 0 -  99 mg/dL   Total CHOL/HDL Ratio 3    NonHDL 121.48   PSA  Result Value Ref Range   PSA 4.00 0.10 - 4.00 ng/mL

## 2020-08-11 NOTE — Patient Instructions (Addendum)
It was very nice to meet you today! I will be in touch with your labs asap, and we will request your colonoscopy report Do work on regular exercise I would also recommend getting a covid 19 booster and the shingles series (Shingrix) if not done already

## 2020-08-13 ENCOUNTER — Encounter: Payer: Self-pay | Admitting: Family Medicine

## 2020-08-13 ENCOUNTER — Other Ambulatory Visit: Payer: Self-pay

## 2020-08-13 ENCOUNTER — Ambulatory Visit (INDEPENDENT_AMBULATORY_CARE_PROVIDER_SITE_OTHER): Payer: Medicare Other | Admitting: Family Medicine

## 2020-08-13 VITALS — BP 132/80 | HR 88 | Temp 97.4°F | Resp 17 | Ht 70.0 in | Wt 173.0 lb

## 2020-08-13 DIAGNOSIS — Z0000 Encounter for general adult medical examination without abnormal findings: Secondary | ICD-10-CM

## 2020-08-13 DIAGNOSIS — Z8249 Family history of ischemic heart disease and other diseases of the circulatory system: Secondary | ICD-10-CM

## 2020-08-13 DIAGNOSIS — E785 Hyperlipidemia, unspecified: Secondary | ICD-10-CM | POA: Diagnosis not present

## 2020-08-13 DIAGNOSIS — Z125 Encounter for screening for malignant neoplasm of prostate: Secondary | ICD-10-CM

## 2020-08-13 DIAGNOSIS — Z130 Encounter for screening for diseases of the blood and blood-forming organs and certain disorders involving the immune mechanism: Secondary | ICD-10-CM

## 2020-08-13 DIAGNOSIS — I6529 Occlusion and stenosis of unspecified carotid artery: Secondary | ICD-10-CM

## 2020-08-13 DIAGNOSIS — R7303 Prediabetes: Secondary | ICD-10-CM

## 2020-08-13 DIAGNOSIS — Z131 Encounter for screening for diabetes mellitus: Secondary | ICD-10-CM

## 2020-08-13 HISTORY — DX: Prediabetes: R73.03

## 2020-08-13 HISTORY — DX: Family history of ischemic heart disease and other diseases of the circulatory system: Z82.49

## 2020-08-13 LAB — COMPREHENSIVE METABOLIC PANEL
ALT: 18 U/L (ref 0–53)
AST: 16 U/L (ref 0–37)
Albumin: 4.5 g/dL (ref 3.5–5.2)
Alkaline Phosphatase: 59 U/L (ref 39–117)
BUN: 22 mg/dL (ref 6–23)
CO2: 30 mEq/L (ref 19–32)
Calcium: 9.6 mg/dL (ref 8.4–10.5)
Chloride: 103 mEq/L (ref 96–112)
Creatinine, Ser: 1.31 mg/dL (ref 0.40–1.50)
GFR: 55.2 mL/min — ABNORMAL LOW (ref >60.00)
Glucose, Bld: 96 mg/dL (ref 70–99)
Potassium: 4.5 mEq/L (ref 3.5–5.1)
Sodium: 139 mEq/L (ref 135–145)
Total Bilirubin: 0.6 mg/dL (ref 0.2–1.2)
Total Protein: 6.7 g/dL (ref 6.0–8.3)

## 2020-08-13 LAB — LIPID PANEL
Cholesterol: 180 mg/dL (ref 0–200)
HDL: 58.1 mg/dL (ref >39.00)
LDL Cholesterol: 94 mg/dL (ref 0–99)
NonHDL: 121.48
Total CHOL/HDL Ratio: 3
Triglycerides: 139 mg/dL (ref 0.0–149.0)
VLDL: 27.8 mg/dL (ref 0.0–40.0)

## 2020-08-13 LAB — CBC
HCT: 44.5 % (ref 39.0–52.0)
Hemoglobin: 14.9 g/dL (ref 13.0–17.0)
MCHC: 33.4 g/dL (ref 30.0–36.0)
MCV: 91.3 fl (ref 78.0–100.0)
Platelets: 240 10*3/uL (ref 150.0–400.0)
RBC: 4.87 Mil/uL (ref 4.22–5.81)
RDW: 13.7 % (ref 11.5–15.5)
WBC: 5.8 10*3/uL (ref 4.0–10.5)

## 2020-08-13 LAB — HEMOGLOBIN A1C: Hgb A1c MFr Bld: 6.2 % (ref 4.6–6.5)

## 2020-08-13 LAB — PSA: PSA: 4 ng/mL (ref 0.10–4.00)

## 2020-08-20 ENCOUNTER — Other Ambulatory Visit: Payer: Self-pay | Admitting: Family Medicine

## 2020-08-20 ENCOUNTER — Other Ambulatory Visit: Payer: Self-pay | Admitting: *Deleted

## 2020-08-20 DIAGNOSIS — R7303 Prediabetes: Secondary | ICD-10-CM

## 2020-08-20 DIAGNOSIS — N138 Other obstructive and reflux uropathy: Secondary | ICD-10-CM

## 2020-08-20 DIAGNOSIS — E785 Hyperlipidemia, unspecified: Secondary | ICD-10-CM

## 2020-08-20 NOTE — Progress Notes (Signed)
Called patient again and left message on machine that I am trying to contact her about labs, will send a lab letter

## 2020-08-23 ENCOUNTER — Telehealth: Payer: Self-pay | Admitting: Family Medicine

## 2020-08-23 DIAGNOSIS — Z8249 Family history of ischemic heart disease and other diseases of the circulatory system: Secondary | ICD-10-CM

## 2020-08-23 NOTE — Telephone Encounter (Signed)
Hi Robert Lee- thank you, please give him a call back and let him know I ordered the carotid ultrasound.  Sorry, I was confused and thought he already had this ordered.  Also, I have been trying to get in touch with him regarding his labs, he is not reading his mychart messages. Can he please respond to me?

## 2020-08-23 NOTE — Telephone Encounter (Signed)
Dr. Patsy Lager I dont see any orders for [place]? Or recent referral. Could you please clarify how I can help this patient?

## 2020-08-23 NOTE — Telephone Encounter (Signed)
Patient stated he still waiting for a carotid artery u/s appt. Please advise

## 2020-08-28 NOTE — Telephone Encounter (Signed)
Left message to return call 

## 2020-08-31 ENCOUNTER — Ambulatory Visit: Payer: Medicare Other | Admitting: Psychiatry

## 2020-10-09 ENCOUNTER — Other Ambulatory Visit: Payer: Self-pay

## 2020-10-09 ENCOUNTER — Ambulatory Visit (HOSPITAL_BASED_OUTPATIENT_CLINIC_OR_DEPARTMENT_OTHER)
Admission: RE | Admit: 2020-10-09 | Discharge: 2020-10-09 | Disposition: A | Discharge status: Home / Self Care | Payer: Medicare Other | Source: Ambulatory Visit | Attending: Family Medicine | Admitting: Family Medicine

## 2020-10-09 DIAGNOSIS — Z8249 Family history of ischemic heart disease and other diseases of the circulatory system: Secondary | ICD-10-CM

## 2020-10-09 DIAGNOSIS — I6521 Occlusion and stenosis of right carotid artery: Secondary | ICD-10-CM | POA: Diagnosis not present

## 2020-10-10 ENCOUNTER — Encounter: Payer: Self-pay | Admitting: Family Medicine

## 2020-10-24 ENCOUNTER — Encounter: Payer: Self-pay | Admitting: Family Medicine

## 2020-11-14 ENCOUNTER — Other Ambulatory Visit: Payer: Medicare Other

## 2020-11-14 ENCOUNTER — Other Ambulatory Visit: Payer: Self-pay

## 2020-11-14 ENCOUNTER — Other Ambulatory Visit (INDEPENDENT_AMBULATORY_CARE_PROVIDER_SITE_OTHER): Payer: Medicare Other

## 2020-11-14 ENCOUNTER — Encounter: Payer: Self-pay | Admitting: Family Medicine

## 2020-11-14 DIAGNOSIS — N138 Other obstructive and reflux uropathy: Secondary | ICD-10-CM

## 2020-11-14 DIAGNOSIS — R7303 Prediabetes: Secondary | ICD-10-CM | POA: Diagnosis not present

## 2020-11-14 DIAGNOSIS — N401 Enlarged prostate with lower urinary tract symptoms: Secondary | ICD-10-CM | POA: Diagnosis not present

## 2020-11-14 DIAGNOSIS — E785 Hyperlipidemia, unspecified: Secondary | ICD-10-CM

## 2020-11-14 LAB — COMPREHENSIVE METABOLIC PANEL
ALT: 17 U/L (ref 0–53)
AST: 18 U/L (ref 0–37)
Albumin: 4.4 g/dL (ref 3.5–5.2)
Alkaline Phosphatase: 59 U/L (ref 39–117)
BUN: 25 mg/dL — ABNORMAL HIGH (ref 6–23)
CO2: 27 mEq/L (ref 19–32)
Calcium: 9.5 mg/dL (ref 8.4–10.5)
Chloride: 102 mEq/L (ref 96–112)
Creatinine, Ser: 1.33 mg/dL (ref 0.40–1.50)
GFR: 54.11 mL/min — ABNORMAL LOW (ref >60.00)
Glucose, Bld: 104 mg/dL — ABNORMAL HIGH (ref 70–99)
Potassium: 4.9 mEq/L (ref 3.5–5.1)
Sodium: 140 mEq/L (ref 135–145)
Total Bilirubin: 0.7 mg/dL (ref 0.2–1.2)
Total Protein: 6.7 g/dL (ref 6.0–8.3)

## 2020-11-14 LAB — LIPID PANEL
Cholesterol: 164 mg/dL (ref 0–200)
HDL: 68.4 mg/dL (ref >39.00)
LDL Cholesterol: 84 mg/dL (ref 0–99)
NonHDL: 95.29
Total CHOL/HDL Ratio: 2
Triglycerides: 55 mg/dL (ref 0.0–149.0)
VLDL: 11 mg/dL (ref 0.0–40.0)

## 2020-11-14 LAB — PSA: PSA: 3.43 ng/mL (ref 0.10–4.00)

## 2020-11-14 LAB — HEMOGLOBIN A1C: Hgb A1c MFr Bld: 6.1 % (ref 4.6–6.5)

## 2021-01-15 ENCOUNTER — Telehealth: Payer: Self-pay

## 2021-01-15 ENCOUNTER — Other Ambulatory Visit: Payer: Self-pay

## 2021-01-15 ENCOUNTER — Telehealth: Payer: Self-pay | Admitting: Psychiatry

## 2021-01-15 DIAGNOSIS — F339 Major depressive disorder, recurrent, unspecified: Secondary | ICD-10-CM

## 2021-01-15 DIAGNOSIS — F419 Anxiety disorder, unspecified: Secondary | ICD-10-CM

## 2021-01-15 MED ORDER — DULOXETINE HCL 60 MG PO CPEP: 60.0000 mg | ORAL_CAPSULE | Freq: Every day | ORAL | 0 refills | Status: DC

## 2021-01-15 MED ORDER — BUPROPION HCL ER (XL) 300 MG PO TB24: 300.0000 mg | ORAL_TABLET | Freq: Every day | ORAL | 0 refills | Status: DC

## 2021-01-15 NOTE — Telephone Encounter (Signed)
Rx sent 

## 2021-01-15 NOTE — Telephone Encounter (Signed)
Pt called for refill of Cymbalta from [facility].  They are saying they have sent refill request to [place] twice and not gotten response.  Pls call them at [phone]  ref# [id number].  Next appt 10/25

## 2021-01-28 ENCOUNTER — Telehealth: Payer: Self-pay | Admitting: Family Medicine

## 2021-01-28 NOTE — Telephone Encounter (Signed)
Left message for patient to call back and schedule Medicare Annual Wellness Visit (AWV) in office.   If not able to come in office, please offer to do virtually or by telephone.  Left office number and my jabber #[phone].  Last AWV:11/06/2019  Please schedule at anytime with Nurse Health Advisor.   

## 2021-01-29 ENCOUNTER — Ambulatory Visit: Payer: Medicare Other | Admitting: Psychiatry

## 2021-02-12 ENCOUNTER — Telehealth: Payer: Self-pay

## 2021-02-12 ENCOUNTER — Other Ambulatory Visit: Payer: Self-pay

## 2021-02-12 ENCOUNTER — Ambulatory Visit (INDEPENDENT_AMBULATORY_CARE_PROVIDER_SITE_OTHER): Payer: Medicare Other

## 2021-02-12 VITALS — BP 118/64 | HR 65 | Temp 98.1°F | Resp 16 | Ht 70.0 in | Wt 166.8 lb

## 2021-02-12 DIAGNOSIS — Z0000 Encounter for general adult medical examination without abnormal findings: Secondary | ICD-10-CM

## 2021-02-12 DIAGNOSIS — Z23 Encounter for immunization: Secondary | ICD-10-CM | POA: Diagnosis not present

## 2021-02-12 MED ORDER — ROSUVASTATIN CALCIUM 10 MG PO TABS: 10.0000 mg | ORAL_TABLET | Freq: Every day | ORAL | 3 refills | Status: DC

## 2021-02-12 NOTE — Progress Notes (Addendum)
Subjective:   Robert Lee is a 70 y.o. male who presents for an Initial Medicare Annual Wellness Visit.   Review of Systems     Cardiac Risk Factors include: advanced age (>19men, >71 women);male gender;dyslipidemia     Objective:    Today's Vitals   02/12/21 0741  BP: 118/64  Pulse: 65  Resp: 16  Temp: 98.1 F (36.7 C)  TempSrc: Temporal  SpO2: 99%  Weight: 166 lb 12.8 oz (75.7 kg)  Height: 5\' 10"  (1.778 m)   Body mass index is 23.93 kg/m.  Advanced Directives 02/12/2021  Does Patient Have a Medical Advance Directive? Yes  Type of 13/11/2020 of [place];Living will  Copy of Healthcare Power of Attorney in Chart? No - copy requested    Current Medications (verified) Outpatient Encounter Medications as of 02/12/2021  Medication Sig   aspirin 81 MG EC tablet Take by mouth.   buPROPion (WELLBUTRIN XL) 300 MG 24 hr tablet Take 1 tablet (300 mg total) by mouth daily.   Cholecalciferol 25 MCG (1000 UT) tablet Take by mouth.   DULoxetine (CYMBALTA) 60 MG capsule Take 1 capsule (60 mg total) by mouth daily.   Omega 3 1200 MG CAPS Take by mouth.   rosuvastatin (CRESTOR) 10 MG tablet Take by mouth.   valACYclovir (VALTREX) 1000 MG tablet TAKE TWO TABLETS BY MOUTH AT ONSET, THEN TAKE TWO TABLETS 12 HOURS LATER AS DIRECTED   No facility-administered encounter medications on file as of 02/12/2021.    Allergies (verified) Patient has no known allergies.   History: Past Medical History:  Diagnosis Date   Depression 11 years ago   Past Surgical History:  Procedure Laterality Date   OTHER SURGICAL HISTORY  1968-1969   scull depression surgery    TONSILLECTOMY     Family History  Problem Relation Age of Onset   Depression Mother    ADD / ADHD Mother    OCD Mother    Stroke Father    ADD / ADHD Son    OCD Daughter    Drug abuse Paternal Grandmother    Social History   Socioeconomic History   Marital status: Married    Spouse name: Not  on file   Number of children: Not on file   Years of education: Not on file   Highest education level: Not on file  Occupational History   Not on file  Tobacco Use   Smoking status: Never   Smokeless tobacco: Never  Substance and Sexual Activity   Alcohol use: Not Currently    Comment: No use in over 20-30 years   Drug use: Never   Sexual activity: Not on file  Other Topics Concern   Not on file  Social History Narrative   Not on file   Social Determinants of Health   Financial Resource Strain: Low Risk    Difficulty of Paying Living Expenses: Not hard at all  Food Insecurity: No Food Insecurity   Worried About 09-07-2004 in the Last Year: Never true   Ran Out of Food in the Last Year: Never true  Transportation Needs: No Transportation Needs   Lack of Transportation (Medical): No   Lack of Transportation (Non-Medical): No  Physical Activity: Insufficiently Active   Days of Exercise per Week: 2 days   Minutes of Exercise per Session: 30 min  Stress: No Stress Concern Present   Feeling of Stress : Not at all  Social Connections: Socially Integrated  Frequency of Communication with Friends and Family: More than three times a week   Frequency of Social Gatherings with Friends and Family: More than three times a week   Attends Religious Services: More than 4 times per year   Active Member of [employer] or Organizations: Yes   Attends Engineer, structural: More than 4 times per year   Marital Status: Married    Tobacco Counseling Counseling given: Not Answered   Clinical Intake:  Pre-visit preparation completed: Yes  Pain : No/denies pain     BMI - recorded: 23.99 Nutritional Status: BMI of 19-24  Normal Nutritional Risks: None Diabetes: No  How often do you need to have someone help you when you read instructions, pamphlets, or other written materials from your doctor or pharmacy?: 1 - Never  Diabetic?No  Interpreter Needed?: No  Information  entered by :: Thomasenia Sales LPN   Activities of Daily Living In your present state of health, do you have any difficulty performing the following activities: 02/12/2021  Hearing? Y  Comment hearing aids  Vision? N  Difficulty concentrating or making decisions? N  Walking or climbing stairs? N  Dressing or bathing? N  Doing errands, shopping? N  Preparing Food and eating ? N  Using the Toilet? N  In the past six months, have you accidently leaked urine? N  Do you have problems with loss of bowel control? N  Managing your Medications? N  Managing your Finances? N  Housekeeping or managing your Housekeeping? N  Some recent data might be hidden    Patient Care Team: Copland, Gwenlyn Found, MD as PCP - General (Family Medicine)  Indicate any recent Medical Services you may have received from other than [facility] providers in the past year (date may be approximate).     Assessment:   This is a routine wellness examination for Robert Lee.  Hearing/Vision screen Hearing Screening - Comments:: Bilateral hearing aids Vision Screening - Comments:: Reading glasses Last eye exam-01/2021-Dr Sun  Dietary issues and exercise activities discussed: Current Exercise Habits: Home exercise routine, Type of exercise: walking, Time (Minutes): 30, Frequency (Times/Week): 2, Weekly Exercise (Minutes/Week): 60, Intensity: Mild, Exercise limited by: None identified   Goals Addressed             This Visit's Progress    Patient Stated       Drink more water & continue eating healthy & walk more       Depression Screen PHQ 2/9 Scores 02/12/2021 08/13/2020 08/13/2020  PHQ - 2 Score 0 0 0    Fall Risk Fall Risk  02/12/2021  Falls in the past year? 0  Number falls in past yr: 0  Injury with Fall? 0  Follow up Falls prevention discussed    FALL RISK PREVENTION PERTAINING TO THE HOME:  Any stairs in or around the home? Yes  If so, are there any without handrails? No  Home free of loose throw rugs in  walkways, pet beds, electrical cords, etc? Yes  Adequate lighting in your home to reduce risk of falls? Yes   ASSISTIVE DEVICES UTILIZED TO PREVENT FALLS:  Life alert? No  Use of a cane, walker or w/c? No  Grab bars in the bathroom? No  Shower chair or bench in shower? Yes  Elevated toilet seat or a handicapped toilet? Yes   TIMED UP AND GO:  Was the test performed? Yes .  Length of time to ambulate 10 feet: 10 sec.   Gait steady and  fast without use of assistive device  Cognitive Function:Normal cognitive status assessed by direct observation by this Nurse Health Advisor. No abnormalities found.          Immunizations Immunization History  Administered Date(s) Administered   Fluad Quad(high Dose 65+) 02/12/2021   Influenza Split 01/17/2008   Influenza, High Dose Seasonal PF 03/20/2016, 01/04/2018, 02/21/2019, 12/15/2019   Moderna Sars-Covid-2 Vaccination 06/09/2019, 07/07/2019   Pneumococcal Conjugate-13 04/18/2013   Pneumococcal Polysaccharide-23 09/21/2015   Tdap 07/24/2011   Zoster, Live 07/24/2011    TDAP status: Up to date  Flu Vaccine status: Completed at today's visit  Pneumococcal vaccine status: Up to date  Covid-19 vaccine status: Information provided on how to obtain vaccines.   Qualifies for Shingles Vaccine? Yes   Zostavax completed Yes   Shingrix Completed?: No.    Education has been provided regarding the importance of this vaccine. Patient has been advised to call insurance company to determine out of pocket expense if they have not yet received this vaccine. Advised may also receive vaccine at local pharmacy or Health Dept. Verbalized acceptance and understanding.  Screening Tests Health Maintenance  Topic Date Due   Hepatitis C Screening  Never done   Zoster Vaccines- Shingrix (1 of 2) Never done   COVID-19 Vaccine (3 - Booster for Moderna series) 09/01/2019   TETANUS/TDAP  07/23/2021   COLONOSCOPY (Pts 45-39yrs Insurance coverage will need  to be confirmed)  12/03/2023   Pneumonia Vaccine 68+ Years old  Completed   INFLUENZA VACCINE  Completed   HPV VACCINES  Aged Out    Health Maintenance  Health Maintenance Due  Topic Date Due   Hepatitis C Screening  Never done   Zoster Vaccines- Shingrix (1 of 2) Never done   COVID-19 Vaccine (3 - Booster for Moderna series) 09/01/2019    Colorectal cancer screening: Type of screening: Colonoscopy. Completed 12/02/2013. Repeat every 10 years  Lung Cancer Screening: (Low Dose CT Chest recommended if Age 45-80 years, 30 pack-year currently smoking OR have quit w/in 15years.) does not qualify.     Additional Screening:  Hepatitis C Screening: does qualify; Patient to discuss with PCP at next office visit  Vision Screening: Recommended annual ophthalmology exams for early detection of glaucoma and other disorders of the eye. Is the patient up to date with their annual eye exam?  Yes  Who is the provider or what is the name of the office in which the patient attends annual eye exams? Dr. Wynelle Link    Dental Screening: Recommended annual dental exams for proper oral hygiene  Community Resource Referral / Chronic Care Management: CRR required this visit?  No   CCM required this visit?  No      Plan:     I have personally reviewed and noted the following in the patient's chart:   Medical and social history Use of alcohol, tobacco or illicit drugs  Current medications and supplements including opioid prescriptions. Patient is not currently taking opioid prescriptions. Functional ability and status Nutritional status Physical activity Advanced directives List of other physicians Hospitalizations, surgeries, and ER visits in previous 12 months Vitals Screenings to include cognitive, depression, and falls Referrals and appointments  In addition, I have reviewed and discussed with patient certain preventive protocols, quality metrics, and best practice recommendations. A written  personalized care plan for preventive services as well as general preventive health recommendations were provided to patient.     Roanna Raider, LPN   [id number]  Nurse Health Advisor  Nurse Notes: None    Medical screening examination/treatment/procedure(s) were performed by non-physician practitioner and as supervising provider I was immediately available for consultation/collaboration.  I agree with above. Olive Bass, FNP

## 2021-02-12 NOTE — Addendum Note (Signed)
Addended by: Abbe Amsterdam C on: 02/12/2021 12:35 PM   Modules accepted: Orders

## 2021-02-12 NOTE — Telephone Encounter (Signed)
Patient is requesting a refill of Rosuvastatin. He would like it to be sent to the mail order pharmacy. [facility]

## 2021-02-12 NOTE — Patient Instructions (Signed)
Robert Lee , Thank you for taking time to come for your Medicare Wellness Visit. I appreciate your ongoing commitment to your health goals. Please review the following plan we discussed and let me know if I can assist you in the future.   Screening recommendations/referrals: Colonoscopy: Completed 11/29/2013-Due-11/30/2023 Recommended yearly ophthalmology/optometry visit for glaucoma screening and checkup Recommended yearly dental visit for hygiene and checkup  Vaccinations: Influenza vaccine: Up to date Pneumococcal vaccine: Up to date Tdap vaccine: Up to date-07/23/2021 Shingles vaccine: Discuss with pharmacy   Covid-19: Booster available at the pharmacy  Advanced directives: Please bring a copy of Living Will and/or Healthcare Power of Attorney for your chart.   Conditions/risks identified: See problem list   Next appointment: Follow up in one year for your annual wellness visit.   Preventive Care 71 Years and Older, Male Preventive care refers to lifestyle choices and visits with your health care provider that can promote health and wellness. What does preventive care include? A yearly physical exam. This is also called an annual well check. Dental exams once or twice a year. Routine eye exams. Ask your health care provider how often you should have your eyes checked. Personal lifestyle choices, including: Daily care of your teeth and gums. Regular physical activity. Eating a healthy diet. Avoiding tobacco and drug use. Limiting alcohol use. Practicing safe sex. Taking low doses of aspirin every day. Taking vitamin and mineral supplements as recommended by your health care provider. What happens during an annual well check? The services and screenings done by your health care provider during your annual well check will depend on your age, overall health, lifestyle risk factors, and family history of disease. Counseling  Your health care provider may ask you questions about  your: Alcohol use. Tobacco use. Drug use. Emotional well-being. Home and relationship well-being. Sexual activity. Eating habits. History of falls. Memory and ability to understand (cognition). Work and work Astronomer. Screening  You may have the following tests or measurements: Height, weight, and BMI. Blood pressure. Lipid and cholesterol levels. These may be checked every 5 years, or more frequently if you are over 30 years old. Skin check. Lung cancer screening. You may have this screening every year starting at age 71 if you have a 30-pack-year history of smoking and currently smoke or have quit within the past 15 years. Fecal occult blood test (FOBT) of the stool. You may have this test every year starting at age 71. Flexible sigmoidoscopy or colonoscopy. You may have a sigmoidoscopy every 5 years or a colonoscopy every 10 years starting at age 71. Prostate cancer screening. Recommendations will vary depending on your family history and other risks. Hepatitis C blood test. Hepatitis B blood test. Sexually transmitted disease (STD) testing. Diabetes screening. This is done by checking your blood sugar (glucose) after you have not eaten for a while (fasting). You may have this done every 1-3 years. Abdominal aortic aneurysm (AAA) screening. You may need this if you are a current or former smoker. Osteoporosis. You may be screened starting at age 71 if you are at high risk. Talk with your health care provider about your test results, treatment options, and if necessary, the need for more tests. Vaccines  Your health care provider may recommend certain vaccines, such as: Influenza vaccine. This is recommended every year. Tetanus, diphtheria, and acellular pertussis (Tdap, Td) vaccine. You may need a Td booster every 10 years. Zoster vaccine. You may need this after age 71. Pneumococcal 13-valent conjugate (PCV13)  vaccine. One dose is recommended after age 71. Pneumococcal  polysaccharide (PPSV23) vaccine. One dose is recommended after age 71. Talk to your health care provider about which screenings and vaccines you need and how often you need them. This information is not intended to replace advice given to you by your health care provider. Make sure you discuss any questions you have with your health care provider. Document Released: 04/20/2015 Document Revised: 12/12/2015 Document Reviewed: 01/23/2015 Elsevier Interactive Patient Education  2017 [place] Prevention in the Home Falls can cause injuries. They can happen to people of all ages. There are many things you can do to make your home safe and to help prevent falls. What can I do on the outside of my home? Regularly fix the edges of walkways and driveways and fix any cracks. Remove anything that might make you trip as you walk through a door, such as a raised step or threshold. Trim any bushes or trees on the path to your home. Use bright outdoor lighting. Clear any walking paths of anything that might make someone trip, such as rocks or tools. Regularly check to see if handrails are loose or broken. Make sure that both sides of any steps have handrails. Any raised decks and porches should have guardrails on the edges. Have any leaves, snow, or ice cleared regularly. Use sand or salt on walking paths during winter. Clean up any spills in your garage right away. This includes oil or grease spills. What can I do in the bathroom? Use night lights. Install grab bars by the toilet and in the tub and shower. Do not use towel bars as grab bars. Use non-skid mats or decals in the tub or shower. If you need to sit down in the shower, use a plastic, non-slip stool. Keep the floor dry. Clean up any water that spills on the floor as soon as it happens. Remove soap buildup in the tub or shower regularly. Attach bath mats securely with double-sided non-slip rug tape. Do not have throw rugs and other  things on the floor that can make you trip. What can I do in the bedroom? Use night lights. Make sure that you have a light by your bed that is easy to reach. Do not use any sheets or blankets that are too big for your bed. They should not hang down onto the floor. Have a firm chair that has side arms. You can use this for support while you get dressed. Do not have throw rugs and other things on the floor that can make you trip. What can I do in the kitchen? Clean up any spills right away. Avoid walking on wet floors. Keep items that you use a lot in easy-to-reach places. If you need to reach something above you, use a strong step stool that has a grab bar. Keep electrical cords out of the way. Do not use floor polish or wax that makes floors slippery. If you must use wax, use non-skid floor wax. Do not have throw rugs and other things on the floor that can make you trip. What can I do with my stairs? Do not leave any items on the stairs. Make sure that there are handrails on both sides of the stairs and use them. Fix handrails that are broken or loose. Make sure that handrails are as long as the stairways. Check any carpeting to make sure that it is firmly attached to the stairs. Fix any carpet that is loose  or worn. Avoid having throw rugs at the top or bottom of the stairs. If you do have throw rugs, attach them to the floor with carpet tape. Make sure that you have a light switch at the top of the stairs and the bottom of the stairs. If you do not have them, ask someone to add them for you. What else can I do to help prevent falls? Wear shoes that: Do not have high heels. Have rubber bottoms. Are comfortable and fit you well. Are closed at the toe. Do not wear sandals. If you use a stepladder: Make sure that it is fully opened. Do not climb a closed stepladder. Make sure that both sides of the stepladder are locked into place. Ask someone to hold it for you, if possible. Clearly  mark and make sure that you can see: Any grab bars or handrails. First and last steps. Where the edge of each step is. Use tools that help you move around (mobility aids) if they are needed. These include: Canes. Walkers. Scooters. Crutches. Turn on the lights when you go into a dark area. Replace any light bulbs as soon as they burn out. Set up your furniture so you have a clear path. Avoid moving your furniture around. If any of your floors are uneven, fix them. If there are any pets around you, be aware of where they are. Review your medicines with your doctor. Some medicines can make you feel dizzy. This can increase your chance of falling. Ask your doctor what other things that you can do to help prevent falls. This information is not intended to replace advice given to you by your health care provider. Make sure you discuss any questions you have with your health care provider. Document Released: 01/18/2009 Document Revised: 08/30/2015 Document Reviewed: 04/28/2014 Elsevier Interactive Patient Education  2017 [employer].

## 2021-02-13 ENCOUNTER — Other Ambulatory Visit (HOSPITAL_BASED_OUTPATIENT_CLINIC_OR_DEPARTMENT_OTHER): Payer: Self-pay

## 2021-02-13 MED ORDER — BESIVANCE 0.6 % OP SUSP
OPHTHALMIC | 1 refills | Status: DC
  Filled 2021-02-13: qty 5, 30d supply, fill #0

## 2021-02-13 MED ORDER — PROLENSA 0.07 % OP SOLN
OPHTHALMIC | 1 refills | Status: DC
  Filled 2021-02-13: qty 3, 31d supply, fill #0

## 2021-02-13 MED ORDER — DUREZOL 0.05 % OP EMUL
OPHTHALMIC | 1 refills | Status: DC
  Filled 2021-02-13: qty 5, 30d supply, fill #0

## 2021-02-14 ENCOUNTER — Other Ambulatory Visit (HOSPITAL_BASED_OUTPATIENT_CLINIC_OR_DEPARTMENT_OTHER): Payer: Self-pay

## 2021-05-30 ENCOUNTER — Other Ambulatory Visit (HOSPITAL_BASED_OUTPATIENT_CLINIC_OR_DEPARTMENT_OTHER): Payer: Self-pay

## 2021-07-16 ENCOUNTER — Telehealth: Payer: Self-pay | Admitting: Psychiatry

## 2021-07-16 DIAGNOSIS — F419 Anxiety disorder, unspecified: Secondary | ICD-10-CM

## 2021-07-16 DIAGNOSIS — F339 Major depressive disorder, recurrent, unspecified: Secondary | ICD-10-CM

## 2021-07-16 MED ORDER — BUPROPION HCL ER (XL) 300 MG PO TB24: 300.0000 mg | ORAL_TABLET | Freq: Every day | ORAL | 0 refills | Status: DC

## 2021-07-16 MED ORDER — DULOXETINE HCL 60 MG PO CPEP: 60.0000 mg | ORAL_CAPSULE | Freq: Every day | ORAL | 0 refills | Status: DC

## 2021-07-16 NOTE — Telephone Encounter (Signed)
Refill sent.

## 2021-07-16 NOTE — Telephone Encounter (Signed)
Pt says [facility] has requested RF on Wellbutrin and Cymbalta for him. Please send in new Rxs to [place] in his chart. ?Appt  made 4/27 ?

## 2021-07-16 NOTE — Telephone Encounter (Signed)
Noted has scheduled apt on 4/27 but last visit was 07/2020. ? ?Pt has canceled last 2 visits scheduled. Will submit refill as requested but will update Jessica.  ?

## 2021-08-01 ENCOUNTER — Encounter: Payer: Self-pay | Admitting: Psychiatry

## 2021-08-01 ENCOUNTER — Ambulatory Visit (INDEPENDENT_AMBULATORY_CARE_PROVIDER_SITE_OTHER): Payer: Medicare Other | Admitting: Psychiatry

## 2021-08-01 DIAGNOSIS — F419 Anxiety disorder, unspecified: Secondary | ICD-10-CM

## 2021-08-01 DIAGNOSIS — F339 Major depressive disorder, recurrent, unspecified: Secondary | ICD-10-CM | POA: Diagnosis not present

## 2021-08-01 MED ORDER — BUPROPION HCL ER (XL) 300 MG PO TB24: 300.0000 mg | ORAL_TABLET | Freq: Every day | ORAL | 3 refills | Status: DC

## 2021-08-01 MED ORDER — DULOXETINE HCL 60 MG PO CPEP: 60.0000 mg | ORAL_CAPSULE | Freq: Every day | ORAL | 3 refills | Status: DC

## 2021-08-01 NOTE — Progress Notes (Signed)
Robert Lee ?[id number] ?12-13-1950 ?71 y.o. ? ?Subjective:  ? ?Patient ID:  Robert Lee is a 71 y.o. (DOB 01-Jun-1950) male. ? ?Chief Complaint:  ?Chief Complaint  ?Patient presents with  ? Follow-up  ?  History of depression  ? ? ?HPI ?Robert Lee presents to the office today for follow-up of history of depression. He reports "I have a blue day every once and awhile" and lasts 1-2 days. He reports that on those days he will usually sit quietly, read, nap, and does not engage in usual activities. He reports that he can tell when these episodes are about to occur and notices some nervousness.  ? ?He reports that his mood is stable. Denies any significant anxiety. Sleeping well. He reports that he takes a nightly sleep aid. Appetite has been good and eats small amounts  throughout the day. He reports, "I wish I had more energy." He reports that his concentration has been good. He is able to sustain focus. Denies SI.  ? ?Recently found out he had a half-brother (same father) that was killed on the job. He has been contacted by one of his family member.  ? ?He is working full-time and plans to retire at the end of the year. He enjoys the outdoors- camping, fishing, and hunting and hopes to do more outdoors after retirement. He would like to start exercising regularly. He would also like to do more in his yard.  ? ?Robert Lee will be 71 yo and would like to spend more time with him.  ? ?Denies smoking or ETOH use.  ? ?Past Psychiatric Medication Trials: ?Wellbutrin XL- Significant improvement ?Cymbalta ?Reports taking another anti-depressant that was not effective and did not have any side effects ? ?PHQ2-9   ? ?Flowsheet Row Clinical Support from 02/12/2021 in [facility] Office Visit from 08/13/2020 in [facility]  ?PHQ-2 Total Score 0 0  ? ?  ?  ? ?Review of Systems:  ?Review of Systems  ?Genitourinary:  Positive for frequency.   ?Musculoskeletal:  Negative for gait problem.  ?Neurological:  Negative for tremors and headaches.  ?Psychiatric/Behavioral:    ?     Please refer to HPI  ? ?Medications: I have reviewed the patient's current medications. ? ?Current Outpatient Medications  ?Medication Sig Dispense Refill  ? aspirin 81 MG EC tablet Take by mouth.    ? BESIVANCE 0.6 % SUSP Instill 1 drop into left eye three times a day as directed 5 mL 1  ? Cholecalciferol 25 MCG (1000 UT) tablet Take by mouth.    ? diphenhydrAMINE (SIMPLY SLEEP) 25 MG tablet Take 25 mg by mouth at bedtime as needed for sleep.    ? DUREZOL 0.05 % EMUL Instill 1 drop into left eye three times a day as directed 5 mL 1  ? Omega 3 1200 MG CAPS Take by mouth.    ? PROLENSA 0.07 % SOLN Instill 1 drop into left eye every evening as directed 3 mL 1  ? rosuvastatin (CRESTOR) 10 MG tablet Take 1 tablet (10 mg total) by mouth daily. 90 tablet 3  ? valACYclovir (VALTREX) 1000 MG tablet TAKE TWO TABLETS BY MOUTH AT ONSET, THEN TAKE TWO TABLETS 12 HOURS LATER AS DIRECTED    ? buPROPion (WELLBUTRIN XL) 300 MG 24 hr tablet Take 1 tablet (300 mg total) by mouth daily. 90 tablet 3  ? DULoxetine (CYMBALTA) 60 MG capsule Take 1  capsule (60 mg total) by mouth daily. 90 capsule 3  ? ?No current facility-administered medications for this visit.  ? ? ?Medication Side Effects: None ? ?Allergies: No Known Allergies ? ?Past Medical History:  ?Diagnosis Date  ? Depression 11 years ago  ? ? ?Past Medical History, Surgical history, Social history, and Family history were reviewed and updated as appropriate.  ? ?Please see review of systems for further details on the patient's review from today.  ? ?Objective:  ? ?Physical Exam:  ?Wt 171 lb (77.6 kg)   BMI 24.54 kg/m?  ? ?Physical Exam ?Neurological:  ?   Mental Status: He is alert and oriented to person, place, and time.  ?   Cranial Nerves: No dysarthria.  ?Psychiatric:     ?   Attention and Perception: Attention and perception normal.     ?    Mood and Affect: Mood normal.     ?   Speech: Speech normal.     ?   Behavior: Behavior is cooperative.     ?   Thought Content: Thought content normal. Thought content is not paranoid or delusional. Thought content does not include homicidal or suicidal ideation. Thought content does not include homicidal or suicidal plan.     ?   Cognition and Memory: Cognition and memory normal.     ?   Judgment: Judgment normal.  ?   Comments: Insight intact  ? ? ?Lab Review:  ?   ?Component Value Date/Time  ? NA 140 11/14/2020 1005  ? K 4.9 11/14/2020 1005  ? CL 102 11/14/2020 1005  ? CO2 27 11/14/2020 1005  ? GLUCOSE 104 (H) 11/14/2020 1005  ? BUN 25 (H) 11/14/2020 1005  ? CREATININE 1.33 11/14/2020 1005  ? CALCIUM 9.5 11/14/2020 1005  ? PROT 6.7 11/14/2020 1005  ? ALBUMIN 4.4 11/14/2020 1005  ? AST 18 11/14/2020 1005  ? ALT 17 11/14/2020 1005  ? ALKPHOS 59 11/14/2020 1005  ? BILITOT 0.7 11/14/2020 1005  ? ? ?   ?Component Value Date/Time  ? WBC 5.8 08/13/2020 0959  ? RBC 4.87 08/13/2020 0959  ? HGB 14.9 08/13/2020 0959  ? HCT 44.5 08/13/2020 0959  ? PLT 240.0 08/13/2020 0959  ? MCV 91.3 08/13/2020 0959  ? MCHC 33.4 08/13/2020 0959  ? RDW 13.7 08/13/2020 0959  ? ? ?No results found for: POCLITH, LITHIUM  ? ?No results found for: PHENYTOIN, PHENOBARB, VALPROATE, CBMZ  ? ?.res ?Assessment: Plan:   ?Will continue current plan of care since target signs and symptoms are well controlled without any tolerability issues. ?Continue Cymbalta 60 m po qd for depression and anxiety.  ?Continue Wellbutrin XL 300 mg po qd for depression. ?Pt to follow-up in 6 months or sooner if clinically indicated.  ?Patient advised to contact office with any questions, adverse effects, or acute worsening in signs and symptoms. ? ? ?Robert Lee was seen today for follow-up. ? ?Diagnoses and all orders for this visit: ? ?Major depression, recurrent, chronic (HCC) ?-     buPROPion (WELLBUTRIN XL) 300 MG 24 hr tablet; Take 1 tablet (300 mg total) by mouth  daily. ?-     DULoxetine (CYMBALTA) 60 MG capsule; Take 1 capsule (60 mg total) by mouth daily. ? ?Anxiety disorder, unspecified type ?-     DULoxetine (CYMBALTA) 60 MG capsule; Take 1 capsule (60 mg total) by mouth daily. ? ?  ? ?Please see After Visit Summary for patient specific instructions. ? ?Future Appointments  ?Date Time Provider  Department Center  ?01/31/2022  8:30 AM Corie Chiquitoarter, Paulo Keimig, PMHNP [facility] None  ? ? ?No orders of the defined types were placed in this encounter. ? ? ?------------------------------- ?

## 2021-10-24 ENCOUNTER — Other Ambulatory Visit: Payer: Self-pay | Admitting: Psychiatry

## 2021-10-24 DIAGNOSIS — F419 Anxiety disorder, unspecified: Secondary | ICD-10-CM

## 2021-10-24 DIAGNOSIS — F339 Major depressive disorder, recurrent, unspecified: Secondary | ICD-10-CM

## 2021-11-01 NOTE — Patient Instructions (Incomplete)
Good to see you again today- I will be in touch with your labs asap

## 2021-11-01 NOTE — Progress Notes (Unsigned)
[name] [address] [place], [place] [address] [phone]  Date:  11/04/2021   Name:  Robert Lee   DOB:  Mar 03, 1951   MRN:  [id number]  PCP:  Pearline Cables, MD    Chief Complaint: No chief complaint on file.   History of Present Illness:  AWS Robert Lee is a 71 y.o. very pleasant male patient who presents with the following:  Seen today for follow-up, history of prediabetes  Most recent visit with myself about one year ago which was our first visit: He is still working- he is in the [employer] business Married to [place], they have 2 grown children and 3 grands  He is from [place], has lived in this area 45 years  No history of CAD He has done a couple of stress tests in the past due to chest pain Turned out to be costochondritis  Most recent stress test approx 2 years ago- he did fine  He does not drink alcohol or smoke Not getting a lot of exercise right now He is typically enjoying horseback riding-  His horse passed away not long ago- he was 71 years old He does travel quite a bit for his work  His father had a CVA from a carotid- pt has screening on a regular basis, his next ultrasound is coming up  Shingrix Tetanus Covid booster Labs done 8/22  Asa Wellbutrin Cymbalta Eye drops Crestor  Colon done 2015 Patient Active Problem List   Diagnosis Date Noted   Family history of carotid artery stenosis 08/13/2020   Prediabetes 08/13/2020   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    Past Medical History:  Diagnosis Date   Depression 11 years ago    Past Surgical History:  Procedure Laterality Date   OTHER SURGICAL HISTORY  1968-1969   scull depression surgery    TONSILLECTOMY      Social History   Tobacco Use   Smoking status: Never   Smokeless tobacco: Never  Substance Use Topics   Alcohol use: Not Currently    Comment: No use in over  20-30 years   Drug use: Never    Family History  Problem Relation Age of Onset   Depression Mother    ADD / ADHD Mother    OCD Mother    Stroke Father    ADD / ADHD Son    OCD Daughter    Drug abuse Paternal Grandmother     No Known Allergies  Medication list has been reviewed and updated.  Current Outpatient Medications on File Prior to Visit  Medication Sig Dispense Refill   aspirin 81 MG EC tablet Take by mouth.     BESIVANCE 0.6 % SUSP Instill 1 drop into left eye three times a day as directed 5 mL 1   buPROPion (WELLBUTRIN XL) 300 MG 24 hr tablet Take 1 tablet (300 mg total) by mouth daily. 90 tablet 3   Cholecalciferol 25 MCG (1000 UT) tablet Take by mouth.     diphenhydrAMINE (SIMPLY SLEEP) 25 MG tablet Take 25 mg by mouth at bedtime as needed for sleep.     DULoxetine (CYMBALTA) 60 MG capsule TAKE 1 CAPSULE DAILY 90 capsule 0   DUREZOL 0.05 % EMUL Instill 1 drop into left eye three times a day as directed 5 mL 1   Omega 3 1200 MG CAPS Take by mouth.     PROLENSA  0.07 % SOLN Instill 1 drop into left eye every evening as directed 3 mL 1   rosuvastatin (CRESTOR) 10 MG tablet Take 1 tablet (10 mg total) by mouth daily. 90 tablet 3   valACYclovir (VALTREX) 1000 MG tablet TAKE TWO TABLETS BY MOUTH AT ONSET, THEN TAKE TWO TABLETS 12 HOURS LATER AS DIRECTED     No current facility-administered medications on file prior to visit.    Review of Systems:  As per HPI- otherwise negative.   Physical Examination: There were no vitals filed for this visit. There were no vitals filed for this visit. There is no height or weight on file to calculate BMI. Ideal Body Weight:    GEN: no acute distress. HEENT: Atraumatic, Normocephalic.  Ears and Nose: No external deformity. CV: RRR, No M/G/R. No JVD. No thrill. No extra heart sounds. PULM: CTA B, no wheezes, crackles, rhonchi. No retractions. No resp. distress. No accessory muscle use. ABD: S, NT, ND, +BS. No rebound. No  HSM. EXTR: No c/c/e PSYCH: Normally interactive. Conversant.    Assessment and Plan: ***  Signed Abbe Amsterdam, MD

## 2021-11-04 ENCOUNTER — Ambulatory Visit (INDEPENDENT_AMBULATORY_CARE_PROVIDER_SITE_OTHER): Payer: Medicare Other | Admitting: Family Medicine

## 2021-11-04 ENCOUNTER — Ambulatory Visit (HOSPITAL_BASED_OUTPATIENT_CLINIC_OR_DEPARTMENT_OTHER)
Admission: RE | Admit: 2021-11-04 | Discharge: 2021-11-04 | Disposition: A | Discharge status: Home / Self Care | Payer: Medicare Other | Source: Ambulatory Visit | Attending: Family Medicine | Admitting: Family Medicine

## 2021-11-04 ENCOUNTER — Encounter: Payer: Self-pay | Admitting: Family Medicine

## 2021-11-04 VITALS — BP 120/80 | HR 74 | Temp 97.8°F | Resp 18 | Ht 70.0 in | Wt 172.6 lb

## 2021-11-04 DIAGNOSIS — N138 Other obstructive and reflux uropathy: Secondary | ICD-10-CM

## 2021-11-04 DIAGNOSIS — Z130 Encounter for screening for diseases of the blood and blood-forming organs and certain disorders involving the immune mechanism: Secondary | ICD-10-CM

## 2021-11-04 DIAGNOSIS — E785 Hyperlipidemia, unspecified: Secondary | ICD-10-CM | POA: Diagnosis not present

## 2021-11-04 DIAGNOSIS — M5442 Lumbago with sciatica, left side: Secondary | ICD-10-CM | POA: Diagnosis not present

## 2021-11-04 DIAGNOSIS — R7303 Prediabetes: Secondary | ICD-10-CM | POA: Diagnosis not present

## 2021-11-04 DIAGNOSIS — Z125 Encounter for screening for malignant neoplasm of prostate: Secondary | ICD-10-CM

## 2021-11-04 DIAGNOSIS — G8929 Other chronic pain: Secondary | ICD-10-CM | POA: Diagnosis present

## 2021-11-04 DIAGNOSIS — Z8249 Family history of ischemic heart disease and other diseases of the circulatory system: Secondary | ICD-10-CM

## 2021-11-04 DIAGNOSIS — N401 Enlarged prostate with lower urinary tract symptoms: Secondary | ICD-10-CM

## 2021-11-04 DIAGNOSIS — Z5181 Encounter for therapeutic drug level monitoring: Secondary | ICD-10-CM | POA: Diagnosis not present

## 2021-11-04 LAB — CBC
HCT: 41.3 % (ref 39.0–52.0)
Hemoglobin: 13.5 g/dL (ref 13.0–17.0)
MCHC: 32.8 g/dL (ref 30.0–36.0)
MCV: 89.8 fl (ref 78.0–100.0)
Platelets: 230 10*3/uL (ref 150.0–400.0)
RBC: 4.59 Mil/uL (ref 4.22–5.81)
RDW: 14.4 % (ref 11.5–15.5)
WBC: 3.8 10*3/uL — ABNORMAL LOW (ref 4.0–10.5)

## 2021-11-04 LAB — HEMOGLOBIN A1C: Hgb A1c MFr Bld: 6.3 % (ref 4.6–6.5)

## 2021-11-04 LAB — PSA: PSA: 2.65 ng/mL (ref 0.10–4.00)

## 2021-11-04 MED ORDER — TAMSULOSIN HCL 0.4 MG PO CAPS: 0.4000 mg | ORAL_CAPSULE | Freq: Every day | ORAL | 3 refills | Status: DC

## 2021-11-05 ENCOUNTER — Encounter: Payer: Self-pay | Admitting: Family Medicine

## 2021-11-05 LAB — COMPREHENSIVE METABOLIC PANEL
ALT: 14 U/L (ref 0–53)
AST: 18 U/L (ref 0–37)
Albumin: 4.5 g/dL (ref 3.5–5.2)
Alkaline Phosphatase: 56 U/L (ref 39–117)
BUN: 18 mg/dL (ref 6–23)
CO2: 29 mEq/L (ref 19–32)
Calcium: 9.3 mg/dL (ref 8.4–10.5)
Chloride: 104 mEq/L (ref 96–112)
Creatinine, Ser: 1.24 mg/dL (ref 0.40–1.50)
GFR: 58.46 mL/min — ABNORMAL LOW (ref >60.00)
Glucose, Bld: 99 mg/dL (ref 70–99)
Potassium: 4.5 mEq/L (ref 3.5–5.1)
Sodium: 140 mEq/L (ref 135–145)
Total Bilirubin: 0.8 mg/dL (ref 0.2–1.2)
Total Protein: 6.6 g/dL (ref 6.0–8.3)

## 2021-11-05 LAB — LIPID PANEL
Cholesterol: 188 mg/dL (ref 0–200)
HDL: 63.4 mg/dL (ref >39.00)
LDL Cholesterol: 109 mg/dL — ABNORMAL HIGH (ref 0–99)
NonHDL: 124.68
Total CHOL/HDL Ratio: 3
Triglycerides: 80 mg/dL (ref 0.0–149.0)
VLDL: 16 mg/dL (ref 0.0–40.0)

## 2021-12-16 ENCOUNTER — Telehealth: Payer: Self-pay | Admitting: Family Medicine

## 2021-12-16 DIAGNOSIS — G8929 Other chronic pain: Secondary | ICD-10-CM

## 2021-12-16 NOTE — Telephone Encounter (Signed)
Pt called asking to go over info regarding his sciatic nerve pain and A1c levels off his visit from 7.31.23. Melton Alar was unavailable at the time and stated she would give him a call back later. Advised pt and he acknowledged understanding.

## 2021-12-18 ENCOUNTER — Encounter: Payer: Self-pay | Admitting: Family Medicine

## 2021-12-18 NOTE — Telephone Encounter (Signed)
Pt would like to move forward with the MRI that was advised per Xray result.

## 2021-12-27 ENCOUNTER — Ambulatory Visit (HOSPITAL_COMMUNITY)
Admission: RE | Admit: 2021-12-27 | Discharge: 2021-12-27 | Disposition: A | Discharge status: Home / Self Care | Payer: Medicare Other | Source: Ambulatory Visit | Attending: Family Medicine | Admitting: Family Medicine

## 2021-12-27 DIAGNOSIS — G8929 Other chronic pain: Secondary | ICD-10-CM | POA: Diagnosis present

## 2021-12-27 DIAGNOSIS — M5442 Lumbago with sciatica, left side: Secondary | ICD-10-CM | POA: Diagnosis not present

## 2021-12-31 ENCOUNTER — Encounter: Payer: Self-pay | Admitting: Family Medicine

## 2021-12-31 DIAGNOSIS — M5136 Other intervertebral disc degeneration, lumbar region: Secondary | ICD-10-CM

## 2022-01-28 ENCOUNTER — Other Ambulatory Visit: Payer: Self-pay | Admitting: Psychiatry

## 2022-01-28 DIAGNOSIS — F339 Major depressive disorder, recurrent, unspecified: Secondary | ICD-10-CM

## 2022-01-28 DIAGNOSIS — F419 Anxiety disorder, unspecified: Secondary | ICD-10-CM

## 2022-01-31 ENCOUNTER — Ambulatory Visit (INDEPENDENT_AMBULATORY_CARE_PROVIDER_SITE_OTHER): Payer: Medicare Other | Admitting: Psychiatry

## 2022-01-31 ENCOUNTER — Encounter: Payer: Self-pay | Admitting: Psychiatry

## 2022-01-31 DIAGNOSIS — F3342 Major depressive disorder, recurrent, in full remission: Secondary | ICD-10-CM

## 2022-01-31 DIAGNOSIS — F419 Anxiety disorder, unspecified: Secondary | ICD-10-CM

## 2022-01-31 MED ORDER — BUPROPION HCL ER (XL) 300 MG PO TB24: 300.0000 mg | ORAL_TABLET | Freq: Every day | ORAL | 1 refills | Status: DC

## 2022-01-31 MED ORDER — DULOXETINE HCL 60 MG PO CPEP: 60.0000 mg | ORAL_CAPSULE | Freq: Every day | ORAL | 1 refills | Status: DC

## 2022-01-31 NOTE — Progress Notes (Signed)
Robert Lee [id number] 03-17-1951 71 y.o.  Subjective:   Patient ID:  Robert Lee is a 72 y.o. (DOB 18-Nov-1950) male.  Chief Complaint:  Chief Complaint  Patient presents with  . Follow-up    Depression    HPI Robert Lee presents to the office today for follow-up of depression.   He reports, "No complaints." Denies depressed mood or anxiety. Sleeping well.Energy and motivation are good. Appetite has been good. He reports concentration is consistent with baseline. He reports that he will start one task, jump to another task, and then going back to the original task. Continues to enjoy hobbies and outdoor activities. Denies SI.   He is planning on retiring in January. He reports that he has some sadness about retiring with leaving a job he enjoys and people. He is looking forward to spending time with 11 yo grandson.   Past Psychiatric Medication Trials: Wellbutrin XL- Significant improvement Cymbalta Reports taking another anti-depressant that was not effective and did not have any side effects  PHQ2-9    Flowsheet Row Office Visit from 11/04/2021 in [facility] [place] from 02/12/2021 in [facility] [place] Visit from 08/13/2020 in [facility]  PHQ-2 Total Score 1 0 0  PHQ-9 Total Score 2 -- --        Review of Systems:  Review of Systems  Endocrine:       He reports an increase in Hemoglobin A1C  Musculoskeletal:  Negative for gait problem.       Sciatic pain has resolved  Neurological:  Negative for tremors and headaches.  Psychiatric/Behavioral:         Please refer to HPI    Medications: I have reviewed the patient's current medications.  Current Outpatient Medications  Medication Sig Dispense Refill  . aspirin 81 MG EC tablet Take by mouth.    . diphenhydrAMINE (SIMPLY SLEEP) 25 MG tablet Take 25 mg by mouth at bedtime as  needed for sleep.    . Omega 3 1200 MG CAPS Take by mouth.    . rosuvastatin (CRESTOR) 10 MG tablet Take 1 tablet (10 mg total) by mouth daily. 90 tablet 3  . valACYclovir (VALTREX) 1000 MG tablet TAKE TWO TABLETS BY MOUTH AT ONSET, THEN TAKE TWO TABLETS 12 HOURS LATER AS DIRECTED    . buPROPion (WELLBUTRIN XL) 300 MG 24 hr tablet Take 1 tablet (300 mg total) by mouth daily. 90 tablet 1  . DULoxetine (CYMBALTA) 60 MG capsule Take 1 capsule (60 mg total) by mouth daily. 90 capsule 1  . tamsulosin (FLOMAX) 0.4 MG CAPS capsule Take 1 capsule (0.4 mg total) by mouth daily. (Patient not taking: Reported on 01/31/2022) 30 capsule 3   No current facility-administered medications for this visit.    Medication Side Effects: None  Allergies: No Known Allergies  Past Medical History:  Diagnosis Date  . Depression 11 years ago    Past Medical History, Surgical history, Social history, and Family history were reviewed and updated as appropriate.   Please see review of systems for further details on the patient's review from today.   Objective:   Physical Exam:  There were no vitals taken for this visit.  Physical Exam Constitutional:      General: He is not in acute distress. Musculoskeletal:        General: No deformity.  Neurological:  Mental Status: He is alert and oriented to person, place, and time.     Coordination: Coordination normal.  Psychiatric:        Attention and Perception: Attention and perception normal. He does not perceive auditory or visual hallucinations.        Mood and Affect: Mood normal. Mood is not anxious or depressed. Affect is not labile, blunt, angry or inappropriate.        Speech: Speech normal.        Behavior: Behavior normal.        Thought Content: Thought content normal. Thought content is not paranoid or delusional. Thought content does not include homicidal or suicidal ideation. Thought content does not include homicidal or suicidal plan.         Cognition and Memory: Cognition and memory normal.        Judgment: Judgment normal.     Comments: Insight intact    Lab Review:     Component Value Date/Time   NA 140 11/04/2021 1116   K 4.5 11/04/2021 1116   CL 104 11/04/2021 1116   CO2 29 11/04/2021 1116   GLUCOSE 99 11/04/2021 1116   BUN 18 11/04/2021 1116   CREATININE 1.24 11/04/2021 1116   CALCIUM 9.3 11/04/2021 1116   PROT 6.6 11/04/2021 1116   ALBUMIN 4.5 11/04/2021 1116   AST 18 11/04/2021 1116   ALT 14 11/04/2021 1116   ALKPHOS 56 11/04/2021 1116   BILITOT 0.8 11/04/2021 1116       Component Value Date/Time   WBC 3.8 (L) 11/04/2021 1116   RBC 4.59 11/04/2021 1116   HGB 13.5 11/04/2021 1116   HCT 41.3 11/04/2021 1116   PLT 230.0 11/04/2021 1116   MCV 89.8 11/04/2021 1116   MCHC 32.8 11/04/2021 1116   RDW 14.4 11/04/2021 1116    No results found for: "POCLITH", "LITHIUM"   No results found for: "PHENYTOIN", "PHENOBARB", "VALPROATE", "CBMZ"   .res Assessment: Plan:      [name] was seen today for follow-up.  Diagnoses and all orders for this visit:  Recurrent major depressive disorder, in full remission (HCC) -     buPROPion (WELLBUTRIN XL) 300 MG 24 hr tablet; Take 1 tablet (300 mg total) by mouth daily. -     DULoxetine (CYMBALTA) 60 MG capsule; Take 1 capsule (60 mg total) by mouth daily.  Anxiety disorder, unspecified type -     DULoxetine (CYMBALTA) 60 MG capsule; Take 1 capsule (60 mg total) by mouth daily.     Please see After Visit Summary for patient specific instructions.  Future Appointments  Date Time Provider Department Center  08/01/2022  8:30 AM Corie Chiquito, PMHNP [facility] None    No orders of the defined types were placed in this encounter.   -------------------------------

## 2022-03-06 ENCOUNTER — Telehealth: Payer: Medicare Other | Admitting: Family Medicine

## 2022-03-06 NOTE — Patient Instructions (Addendum)
No show

## 2022-03-06 NOTE — Progress Notes (Signed)
No show

## 2022-03-18 ENCOUNTER — Other Ambulatory Visit: Payer: Self-pay | Admitting: Family Medicine

## 2022-03-25 ENCOUNTER — Ambulatory Visit (INDEPENDENT_AMBULATORY_CARE_PROVIDER_SITE_OTHER): Payer: Medicare Other | Admitting: *Deleted

## 2022-03-25 ENCOUNTER — Telehealth: Payer: BC Managed Care – PPO | Admitting: Family Medicine

## 2022-03-25 DIAGNOSIS — N401 Enlarged prostate with lower urinary tract symptoms: Secondary | ICD-10-CM | POA: Diagnosis not present

## 2022-03-25 DIAGNOSIS — Z0000 Encounter for general adult medical examination without abnormal findings: Secondary | ICD-10-CM

## 2022-03-25 DIAGNOSIS — N138 Other obstructive and reflux uropathy: Secondary | ICD-10-CM

## 2022-03-25 MED ORDER — TAMSULOSIN HCL 0.4 MG PO CAPS: 0.4000 mg | ORAL_CAPSULE | Freq: Every day | ORAL | 1 refills | Status: DC

## 2022-03-25 NOTE — Patient Instructions (Signed)
Robert Lee , Thank you for taking time to come for your Medicare Wellness Visit. I appreciate your ongoing commitment to your health goals. Please review the following plan we discussed and let me know if I can assist you in the future.   These are the goals we discussed:  Goals      Patient Stated     Drink more water & continue eating healthy & walk more        This is a list of the screening recommended for you and due dates:  Health Maintenance  Topic Date Due   Hepatitis C Screening: USPSTF Recommendation to screen - Ages 4-79 yo.  Never done   Zoster (Shingles) Vaccine (1 of 2) Never done   DTaP/Tdap/Td vaccine (2 - Td or Tdap) 07/23/2021   Flu Shot  11/05/2021   COVID-19 Vaccine (3 - 2023-24 season) 12/06/2021   Medicare Annual Wellness Visit  03/26/2023   Colon Cancer Screening  12/03/2023   Pneumonia Vaccine  Completed   HPV Vaccine  Aged Out     Next appointment: Follow up in one year for your annual wellness visit.   Preventive Care 39 Years and Older, Male Preventive care refers to lifestyle choices and visits with your health care provider that can promote health and wellness. What does preventive care include? A yearly physical exam. This is also called an annual well check. Dental exams once or twice a year. Routine eye exams. Ask your health care provider how often you should have your eyes checked. Personal lifestyle choices, including: Daily care of your teeth and gums. Regular physical activity. Eating a healthy diet. Avoiding tobacco and drug use. Limiting alcohol use. Practicing safe sex. Taking low doses of aspirin every day. Taking vitamin and mineral supplements as recommended by your health care provider. What happens during an annual well check? The services and screenings done by your health care provider during your annual well check will depend on your age, overall health, lifestyle risk factors, and family history of disease. Counseling   Your health care provider may ask you questions about your: Alcohol use. Tobacco use. Drug use. Emotional well-being. Home and relationship well-being. Sexual activity. Eating habits. History of falls. Memory and ability to understand (cognition). Work and work Astronomer. Screening  You may have the following tests or measurements: Height, weight, and BMI. Blood pressure. Lipid and cholesterol levels. These may be checked every 5 years, or more frequently if you are over 79 years old. Skin check. Lung cancer screening. You may have this screening every year starting at age 50 if you have a 30-pack-year history of smoking and currently smoke or have quit within the past 15 years. Fecal occult blood test (FOBT) of the stool. You may have this test every year starting at age 24. Flexible sigmoidoscopy or colonoscopy. You may have a sigmoidoscopy every 5 years or a colonoscopy every 10 years starting at age 36. Prostate cancer screening. Recommendations will vary depending on your family history and other risks. Hepatitis C blood test. Hepatitis B blood test. Sexually transmitted disease (STD) testing. Diabetes screening. This is done by checking your blood sugar (glucose) after you have not eaten for a while (fasting). You may have this done every 1-3 years. Abdominal aortic aneurysm (AAA) screening. You may need this if you are a current or former smoker. Osteoporosis. You may be screened starting at age 37 if you are at high risk. Talk with your health care provider about your test  results, treatment options, and if necessary, the need for more tests. Vaccines  Your health care provider may recommend certain vaccines, such as: Influenza vaccine. This is recommended every year. Tetanus, diphtheria, and acellular pertussis (Tdap, Td) vaccine. You may need a Td booster every 10 years. Zoster vaccine. You may need this after age 53. Pneumococcal 13-valent conjugate (PCV13) vaccine.  One dose is recommended after age 44. Pneumococcal polysaccharide (PPSV23) vaccine. One dose is recommended after age 49. Talk to your health care provider about which screenings and vaccines you need and how often you need them. This information is not intended to replace advice given to you by your health care provider. Make sure you discuss any questions you have with your health care provider. Document Released: 04/20/2015 Document Revised: 12/12/2015 Document Reviewed: 01/23/2015 Elsevier Interactive Patient Education  2017 [employer] Prevention in the Home Falls can cause injuries. They can happen to people of all ages. There are many things you can do to make your home safe and to help prevent falls. What can I do on the outside of my home? Regularly fix the edges of walkways and driveways and fix any cracks. Remove anything that might make you trip as you walk through a door, such as a raised step or threshold. Trim any bushes or trees on the path to your home. Use bright outdoor lighting. Clear any walking paths of anything that might make someone trip, such as rocks or tools. Regularly check to see if handrails are loose or broken. Make sure that both sides of any steps have handrails. Any raised decks and porches should have guardrails on the edges. Have any leaves, snow, or ice cleared regularly. Use sand or salt on walking paths during winter. Clean up any spills in your garage right away. This includes oil or grease spills. What can I do in the bathroom? Use night lights. Install grab bars by the toilet and in the tub and shower. Do not use towel bars as grab bars. Use non-skid mats or decals in the tub or shower. If you need to sit down in the shower, use a plastic, non-slip stool. Keep the floor dry. Clean up any water that spills on the floor as soon as it happens. Remove soap buildup in the tub or shower regularly. Attach bath mats securely with double-sided  non-slip rug tape. Do not have throw rugs and other things on the floor that can make you trip. What can I do in the bedroom? Use night lights. Make sure that you have a light by your bed that is easy to reach. Do not use any sheets or blankets that are too big for your bed. They should not hang down onto the floor. Have a firm chair that has side arms. You can use this for support while you get dressed. Do not have throw rugs and other things on the floor that can make you trip. What can I do in the kitchen? Clean up any spills right away. Avoid walking on wet floors. Keep items that you use a lot in easy-to-reach places. If you need to reach something above you, use a strong step stool that has a grab bar. Keep electrical cords out of the way. Do not use floor polish or wax that makes floors slippery. If you must use wax, use non-skid floor wax. Do not have throw rugs and other things on the floor that can make you trip. What can I do with my stairs?  Do not leave any items on the stairs. Make sure that there are handrails on both sides of the stairs and use them. Fix handrails that are broken or loose. Make sure that handrails are as long as the stairways. Check any carpeting to make sure that it is firmly attached to the stairs. Fix any carpet that is loose or worn. Avoid having throw rugs at the top or bottom of the stairs. If you do have throw rugs, attach them to the floor with carpet tape. Make sure that you have a light switch at the top of the stairs and the bottom of the stairs. If you do not have them, ask someone to add them for you. What else can I do to help prevent falls? Wear shoes that: Do not have high heels. Have rubber bottoms. Are comfortable and fit you well. Are closed at the toe. Do not wear sandals. If you use a stepladder: Make sure that it is fully opened. Do not climb a closed stepladder. Make sure that both sides of the stepladder are locked into place. Ask  someone to hold it for you, if possible. Clearly mark and make sure that you can see: Any grab bars or handrails. First and last steps. Where the edge of each step is. Use tools that help you move around (mobility aids) if they are needed. These include: Canes. Walkers. Scooters. Crutches. Turn on the lights when you go into a dark area. Replace any light bulbs as soon as they burn out. Set up your furniture so you have a clear path. Avoid moving your furniture around. If any of your floors are uneven, fix them. If there are any pets around you, be aware of where they are. Review your medicines with your doctor. Some medicines can make you feel dizzy. This can increase your chance of falling. Ask your doctor what other things that you can do to help prevent falls. This information is not intended to replace advice given to you by your health care provider. Make sure you discuss any questions you have with your health care provider. Document Released: 01/18/2009 Document Revised: 08/30/2015 Document Reviewed: 04/28/2014 Elsevier Interactive Patient Education  2017 [employer].

## 2022-03-25 NOTE — Progress Notes (Signed)
Subjective:   Robert Lee is a 71 y.o. male who presents for Medicare Annual/Subsequent preventive examination.  I connected with  Robert Lee on 03/25/22 by a audio enabled telemedicine application and verified that I am speaking with the correct person using two identifiers.  Patient Location: Home  Provider Location: Office/Clinic  I discussed the limitations of evaluation and management by telemedicine. The patient expressed understanding and agreed to proceed.   Review of Systems    Defer to PCP Cardiac Risk Factors include: advanced age (>6355men, 65>65 women);male gender;dyslipidemia     Objective:    There were no vitals filed for this visit. There is no height or weight on file to calculate BMI.     03/25/2022    3:47 PM 02/12/2021    7:50 AM  Advanced Directives  Does Patient Have a Medical Advance Directive? Yes Yes  Type of Estate agentAdvance Directive Healthcare Power of [place];Living will;Out of facility DNR (pink MOST or yellow form) Healthcare Power of [place];Living will  Does patient want to make changes to medical advance directive? No - Patient declined   Copy of Healthcare Power of Attorney in Chart? No - copy requested No - copy requested    Current Medications (verified) Outpatient Encounter Medications as of 03/25/2022  Medication Sig   aspirin 81 MG EC tablet Take by mouth.   buPROPion (WELLBUTRIN XL) 300 MG 24 hr tablet Take 1 tablet (300 mg total) by mouth daily.   diphenhydrAMINE (SIMPLY SLEEP) 25 MG tablet Take 25 mg by mouth at bedtime as needed for sleep.   DULoxetine (CYMBALTA) 60 MG capsule Take 1 capsule (60 mg total) by mouth daily.   Omega 3 1200 MG CAPS Take by mouth.   rosuvastatin (CRESTOR) 10 MG tablet Take 1 tablet (10 mg total) by mouth daily.   tamsulosin (FLOMAX) 0.4 MG CAPS capsule Take 1 capsule (0.4 mg total) by mouth daily.   valACYclovir (VALTREX) 1000 MG tablet TAKE TWO TABLETS BY MOUTH AT ONSET, THEN TAKE TWO TABLETS 12 HOURS  LATER AS DIRECTED   [DISCONTINUED] tamsulosin (FLOMAX) 0.4 MG CAPS capsule Take 1 capsule (0.4 mg total) by mouth daily. (Patient not taking: Reported on 01/31/2022)   No facility-administered encounter medications on file as of 03/25/2022.    Allergies (verified) Patient has no known allergies.   History: Past Medical History:  Diagnosis Date   Depression 11 years ago   Past Surgical History:  Procedure Laterality Date   OTHER SURGICAL HISTORY  1968-1969   scull depression surgery    TONSILLECTOMY     Family History  Problem Relation Age of Onset   Depression Mother    ADD / ADHD Mother    OCD Mother    Stroke Father    Diabetes Father    Drug abuse Paternal Grandmother    OCD Daughter    ADD / ADHD Son    Social History   Socioeconomic History   Marital status: Married    Spouse name: Not on file   Number of children: Not on file   Years of education: Not on file   Highest education level: Not on file  Occupational History   Not on file  Tobacco Use   Smoking status: Never   Smokeless tobacco: Never  Substance and Sexual Activity   Alcohol use: Not Currently    Comment: No use in over 20-30 years   Drug use: Never   Sexual activity: Not on file  Other Topics Concern  Not on file  Social History Narrative   Not on file   Social Determinants of Health   Financial Resource Strain: Low Risk  (02/12/2021)   Overall Financial Resource Strain (CARDIA)    Difficulty of Paying Living Expenses: Not hard at all  Food Insecurity: No Food Insecurity (03/25/2022)   Hunger Vital Sign    Worried About Running Out of Food in the Last Year: Never true    Ran Out of Food in the Last Year: Never true  Transportation Needs: No Transportation Needs (03/25/2022)   PRAPARE - Administrator, Civil Service (Medical): No    Lack of Transportation (Non-Medical): No  Physical Activity: Insufficiently Active (02/12/2021)   Exercise Vital Sign    Days of Exercise per  Week: 2 days    Minutes of Exercise per Session: 30 min  Stress: No Stress Concern Present (02/12/2021)   [employer] of Occupational Health - Occupational Stress Questionnaire    Feeling of Stress : Not at all  Social Connections: Socially Integrated (02/12/2021)   Social Connection and Isolation Panel [NHANES]    Frequency of Communication with Friends and Family: More than three times a week    Frequency of Social Gatherings with Friends and Family: More than three times a week    Attends Religious Services: More than 4 times per year    Active Member of [employer] or Organizations: Yes    Attends Engineer, structural: More than 4 times per year    Marital Status: Married    Tobacco Counseling Counseling given: Not Answered   Clinical Intake:  Pre-visit preparation completed: Yes  Pain : No/denies pain  Diabetes: No  How often do you need to have someone help you when you read instructions, pamphlets, or other written materials from your doctor or pharmacy?: 1 - Never  Activities of Daily Living    03/25/2022    3:56 PM  In your present state of health, do you have any difficulty performing the following activities:  Hearing? 1  Comment wears hearing aids  Vision? 0  Comment wears readers only  Difficulty concentrating or making decisions? 0  Walking or climbing stairs? 0  Dressing or bathing? 0  Doing errands, shopping? 0  Preparing Food and eating ? N  Using the Toilet? N  In the past six months, have you accidently leaked urine? Y  Do you have problems with loss of bowel control? N  Managing your Medications? N  Managing your Finances? N  Housekeeping or managing your Housekeeping? N    Patient Care Team: Copland, Gwenlyn Found, MD as PCP - General (Family Medicine)  Indicate any recent Medical Services you may have received from other than [facility] providers in the past year (date may be approximate).     Assessment:   This is a routine wellness  examination for Robert Lee.  Hearing/Vision screen No results found.  Dietary issues and exercise activities discussed: Current Exercise Habits: The patient does not participate in regular exercise at present, Exercise limited by: None identified   Goals Addressed             This Visit's Progress    Patient Stated   Not on track    Drink more water & continue eating healthy & walk more       Depression Screen    03/25/2022    3:55 PM 11/04/2021   11:38 AM 02/12/2021    7:54 AM 08/13/2020   10:30 AM 08/13/2020  10:29 AM  PHQ 2/9 Scores  PHQ - 2 Score 0 1 0 0 0  PHQ- 9 Score  2       Fall Risk    03/25/2022    3:50 PM 02/12/2021    7:52 AM  Fall Risk   Falls in the past year? 0 0  Number falls in past yr: 0 0  Injury with Fall? 0 0  Risk for fall due to : No Fall Risks   Follow up Falls evaluation completed Falls prevention discussed    FALL RISK PREVENTION PERTAINING TO THE HOME:  Any stairs in or around the home? Yes  If so, are there any without handrails? No  Home free of loose throw rugs in walkways, pet beds, electrical cords, etc? Yes  Adequate lighting in your home to reduce risk of falls? Yes   ASSISTIVE DEVICES UTILIZED TO PREVENT FALLS:  Life alert? No  Use of a cane, walker or w/c? No  Grab bars in the bathroom? No  Shower chair or bench in shower? Yes  Elevated toilet seat or a handicapped toilet?  Comfort height  TIMED UP AND GO:  Was the test performed?  No, audio visit .   Cognitive Function:        03/25/2022    4:02 PM  6CIT Screen  What Year? 0 points  What month? 0 points  What time? 0 points  Count back from 20 0 points  Months in reverse 0 points  Repeat phrase 0 points  Total Score 0 points    Immunizations Immunization History  Administered Date(s) Administered   Fluad Quad(high Dose 65+) 02/12/2021   Hep A / Hep B 11/25/2012   Influenza Split 01/17/2008   Influenza, High Dose Seasonal PF 03/20/2016, 01/04/2018,  02/21/2019, 12/15/2019   Moderna Sars-Covid-2 Vaccination 06/09/2019, 07/07/2019   Pneumococcal Conjugate-13 04/18/2013   Pneumococcal Polysaccharide-23 09/21/2015   Tdap 07/24/2011   Zoster, Live 07/24/2011    TDAP status: Due, Education has been provided regarding the importance of this vaccine. Advised may receive this vaccine at local pharmacy or Health Dept. Aware to provide a copy of the vaccination record if obtained from local pharmacy or Health Dept. Verbalized acceptance and understanding.  Flu Vaccine status: Due, Education has been provided regarding the importance of this vaccine. Advised may receive this vaccine at local pharmacy or Health Dept. Aware to provide a copy of the vaccination record if obtained from local pharmacy or Health Dept. Verbalized acceptance and understanding.  Pneumococcal vaccine status: Up to date  Covid-19 vaccine status: Information provided on how to obtain vaccines.   Qualifies for Shingles Vaccine? Yes   Zostavax completed Yes   Shingrix Completed?: No.    Education has been provided regarding the importance of this vaccine. Patient has been advised to call insurance company to determine out of pocket expense if they have not yet received this vaccine. Advised may also receive vaccine at local pharmacy or Health Dept. Verbalized acceptance and understanding.  Screening Tests Health Maintenance  Topic Date Due   Hepatitis C Screening  Never done   Zoster Vaccines- Shingrix (1 of 2) Never done   DTaP/Tdap/Td (2 - Td or Tdap) 07/23/2021   INFLUENZA VACCINE  11/05/2021   COVID-19 Vaccine (3 - 2023-24 season) 12/06/2021   Medicare Annual Wellness (AWV)  02/12/2022   COLONOSCOPY (Pts 45-81yrs Insurance coverage will need to be confirmed)  12/03/2023   Pneumonia Vaccine 87+ Years old  Completed   HPV VACCINES  Aged Out    Health Maintenance  Health Maintenance Due  Topic Date Due   Hepatitis C Screening  Never done   Zoster Vaccines-  Shingrix (1 of 2) Never done   DTaP/Tdap/Td (2 - Td or Tdap) 07/23/2021   INFLUENZA VACCINE  11/05/2021   COVID-19 Vaccine (3 - 2023-24 season) 12/06/2021   Medicare Annual Wellness (AWV)  02/12/2022    Colorectal cancer screening: Type of screening: Colonoscopy. Completed 12/02/13. Repeat every 10 years  Lung Cancer Screening: (Low Dose CT Chest recommended if Age 30-80 years, 30 pack-year currently smoking OR have quit w/in 15years.) does not qualify.   Additional Screening:  Hepatitis C Screening: does qualify; Completed N/a  Vision Screening: Recommended annual ophthalmology exams for early detection of glaucoma and other disorders of the eye. Is the patient up to date with their annual eye exam?  Yes  Who is the provider or what is the name of the office in which the patient attends annual eye exams? Hasn't found a new eye doctor since previous one retired If pt is not established with a provider, would they like to be referred to a provider to establish care? No .   Dental Screening: Recommended annual dental exams for proper oral hygiene  Community Resource Referral / Chronic Care Management: CRR required this visit?  No   CCM required this visit?  No      Plan:     I have personally reviewed and noted the following in the patient's chart:   Medical and social history Use of alcohol, tobacco or illicit drugs  Current medications and supplements including opioid prescriptions. Patient is not currently taking opioid prescriptions. Functional ability and status Nutritional status Physical activity Advanced directives List of other physicians Hospitalizations, surgeries, and ER visits in previous 12 months Vitals Screenings to include cognitive, depression, and falls Referrals and appointments  In addition, I have reviewed and discussed with patient certain preventive protocols, quality metrics, and best practice recommendations. A written personalized care plan for  preventive services as well as general preventive health recommendations were provided to patient.   Due to this being a telephonic visit, the after visit summary with patients personalized plan was offered to patient via mail or my-chart. Patient would like to access on my-chart.  Donne Anon, [place]   03/25/2022   Nurse Notes: None

## 2022-04-26 ENCOUNTER — Other Ambulatory Visit: Payer: Self-pay | Admitting: Psychiatry

## 2022-04-26 DIAGNOSIS — F3342 Major depressive disorder, recurrent, in full remission: Secondary | ICD-10-CM

## 2022-04-26 DIAGNOSIS — F419 Anxiety disorder, unspecified: Secondary | ICD-10-CM

## 2022-07-14 ENCOUNTER — Telehealth: Payer: Self-pay | Admitting: Psychiatry

## 2022-07-14 DIAGNOSIS — F419 Anxiety disorder, unspecified: Secondary | ICD-10-CM

## 2022-07-14 DIAGNOSIS — F3342 Major depressive disorder, recurrent, in full remission: Secondary | ICD-10-CM

## 2022-07-14 MED ORDER — DULOXETINE HCL 60 MG PO CPEP: 60.0000 mg | ORAL_CAPSULE | Freq: Every day | ORAL | 0 refills | Status: DC

## 2022-07-14 NOTE — Telephone Encounter (Signed)
[facility] [address] in [place], [place] called and said that Robert Lee Duloxetine has not come by mail order yet and he is totally out of his pills. Please call them in at  [address], [place], [place] [address] Phone number is [phone].

## 2022-07-14 NOTE — Telephone Encounter (Signed)
Rx for 7 days sent to requested pharmacy. [facility] said 90-day supply shipped today.

## 2022-08-01 ENCOUNTER — Ambulatory Visit: Payer: Medicare Other | Admitting: Psychiatry

## 2022-08-08 ENCOUNTER — Ambulatory Visit (INDEPENDENT_AMBULATORY_CARE_PROVIDER_SITE_OTHER): Payer: Medicare Other | Admitting: Psychiatry

## 2022-08-08 ENCOUNTER — Encounter: Payer: Self-pay | Admitting: Psychiatry

## 2022-08-08 DIAGNOSIS — F3342 Major depressive disorder, recurrent, in full remission: Secondary | ICD-10-CM

## 2022-08-08 DIAGNOSIS — F419 Anxiety disorder, unspecified: Secondary | ICD-10-CM | POA: Diagnosis not present

## 2022-08-08 IMAGING — US US CAROTID DUPLEX BILAT
1 series · 13 of 24 positions shown · non-contrast
Comparison: 12/14/2017 (Obeka Santali examination images
only, no report).

CLINICAL DATA: Family history of carotid artery disease. Personal
history of hyperlipidemia.

EXAM:
BILATERAL CAROTID DUPLEX ULTRASOUND
TECHNIQUE: Gray scale imaging, color Doppler and duplex ultrasound were
performed of bilateral carotid and vertebral arteries in the neck.

[Series 1: us carotid duplex bilat · 13 of 63 slices shown]
[im 1/63]
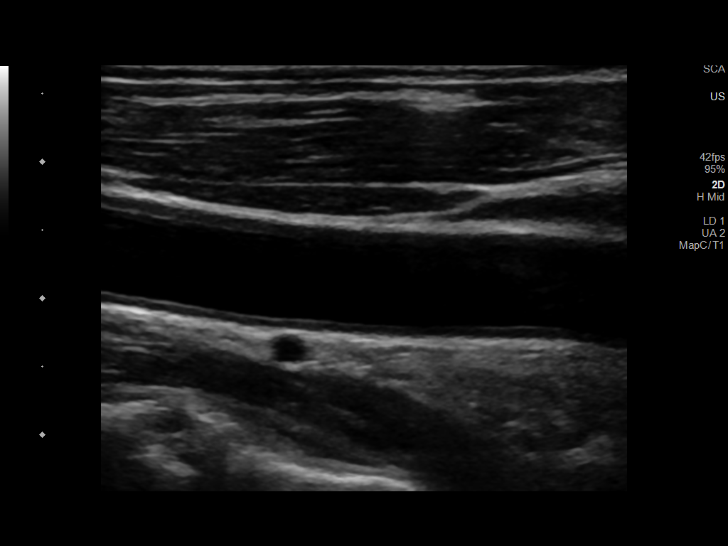
[im 6/63]
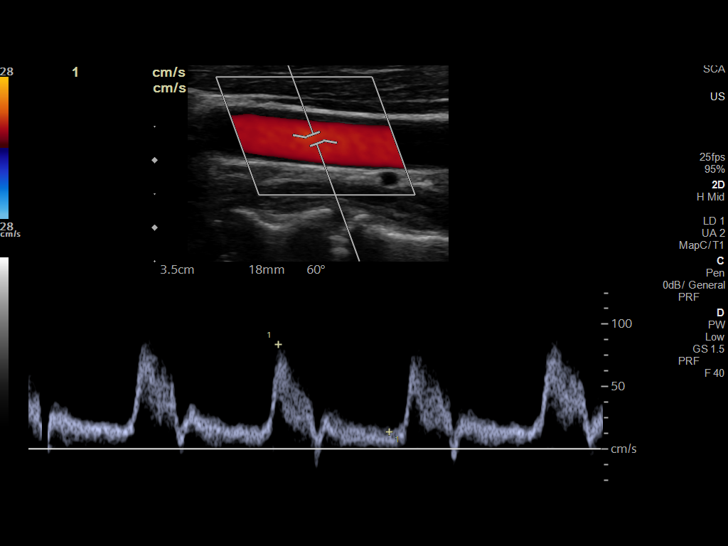
[im 11/63]
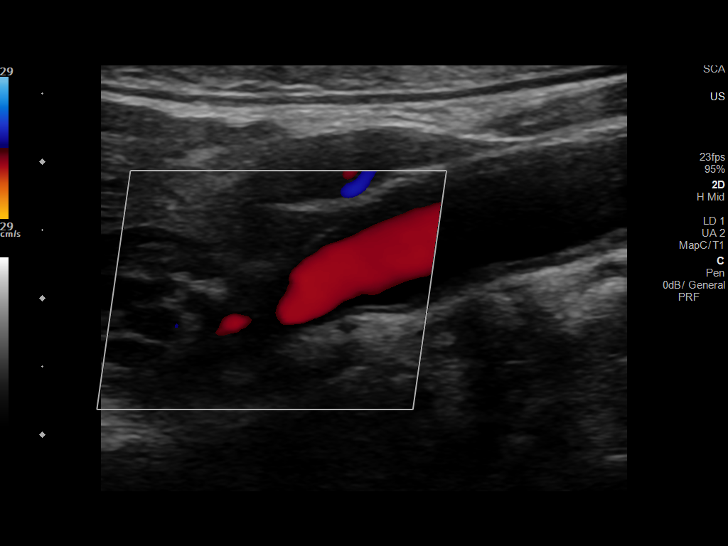
[im 17/63]
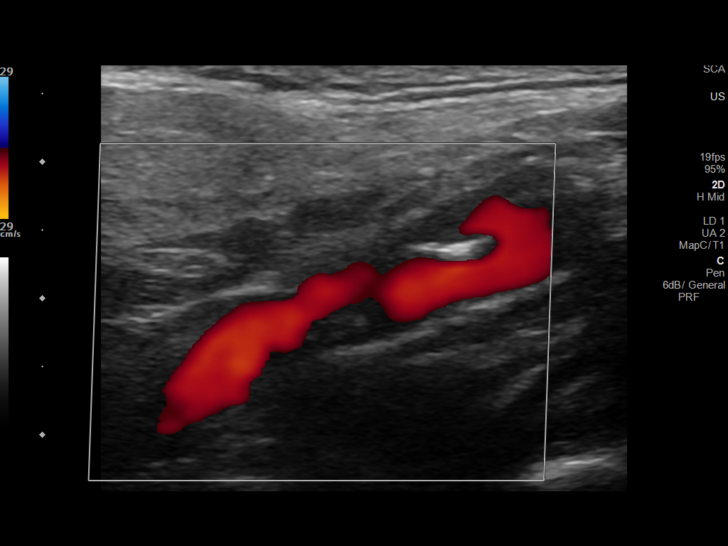
[im 22/63]
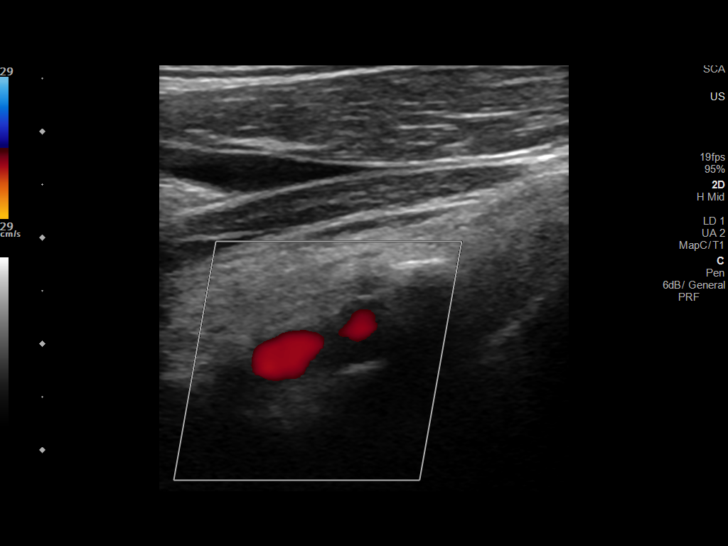
[im 27/63]
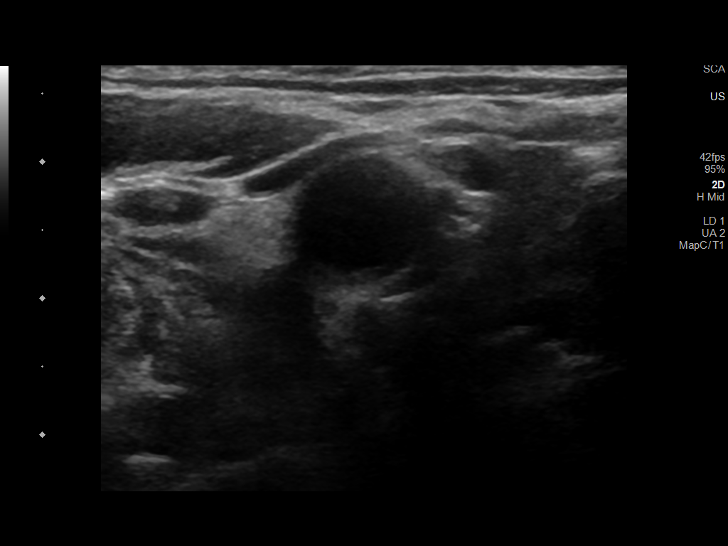
[im 33/63]
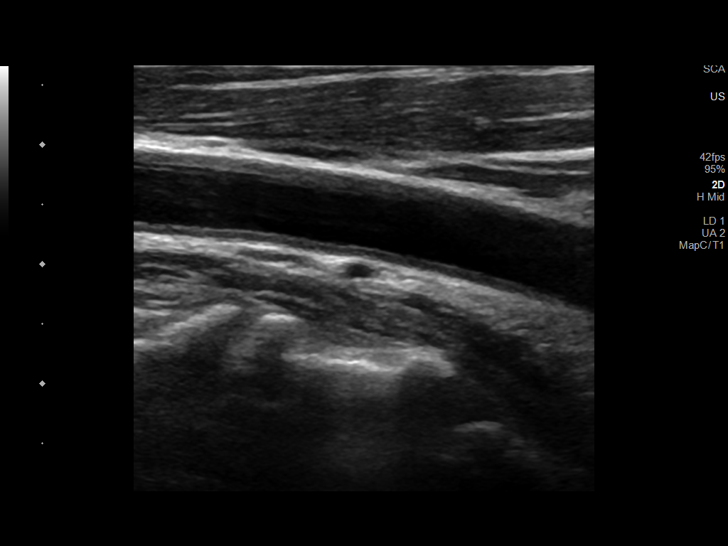
[im 36/63]
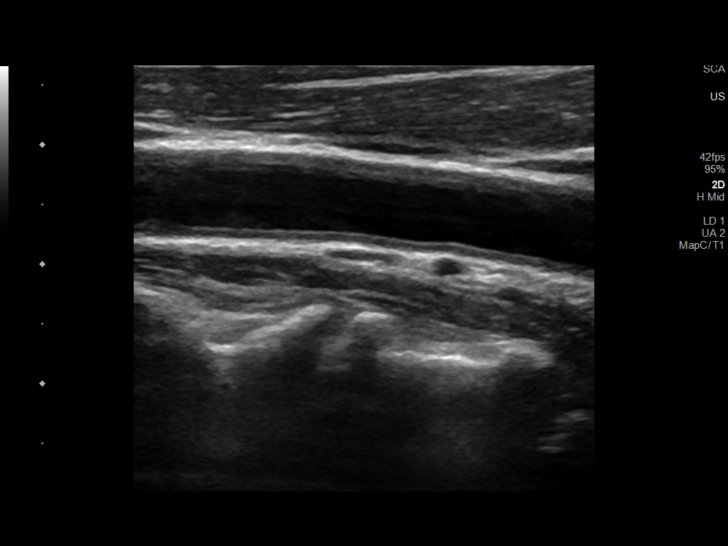
[im 41/63]
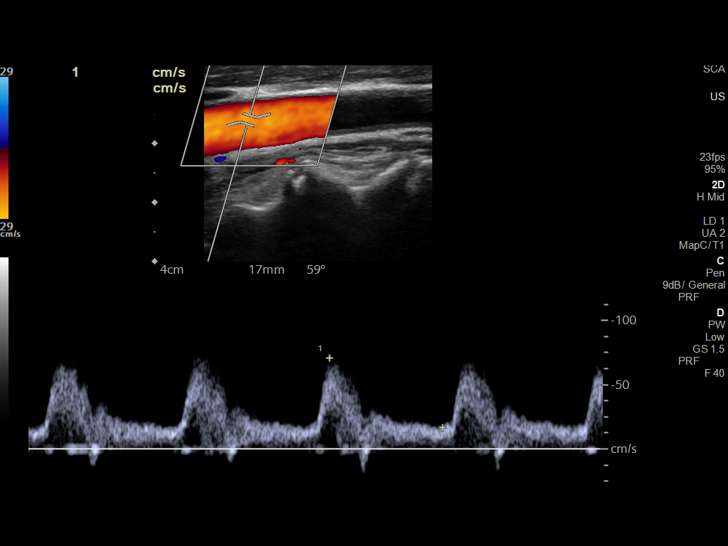
[im 46/63]
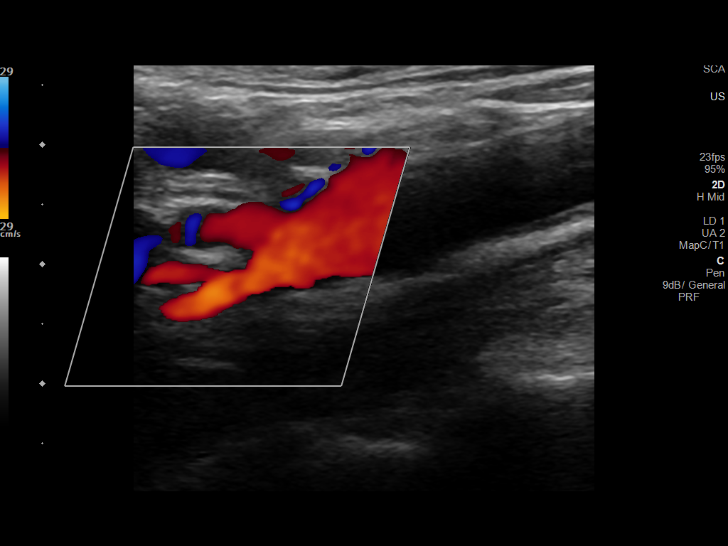
[im 52/63]
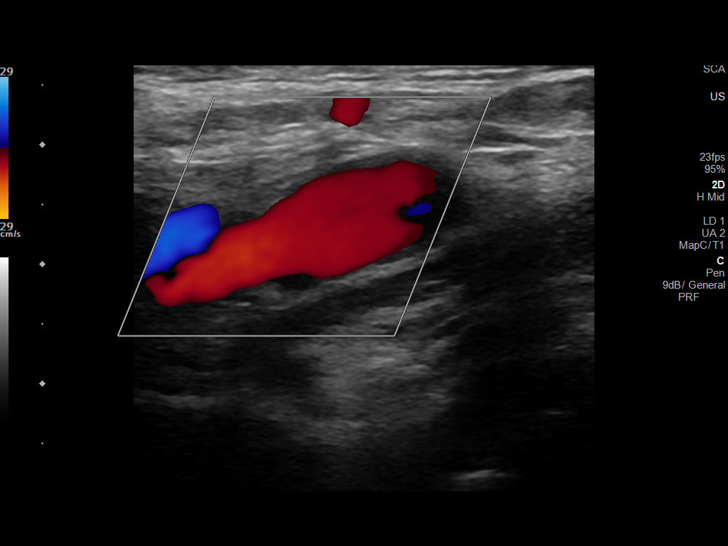
[im 57/63]
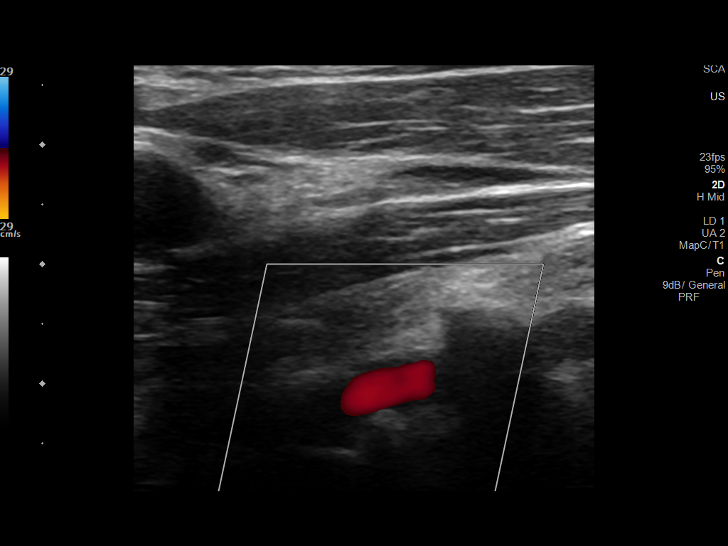
[im 63/63]
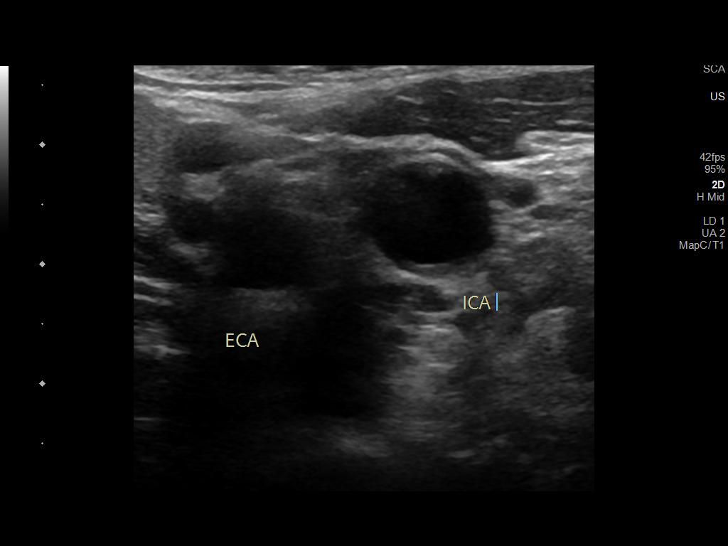

[13 of 24 positions shown; findings below may reference images not displayed]

FINDINGS: Criteria: Quantification of carotid stenosis is based on velocity
parameters that correlate the residual internal carotid diameter
with NASCET-based stenosis levels, using the diameter of the distal
internal carotid lumen as the denominator for stenosis measurement.

The following velocity measurements were obtained:

RIGHT

ICA: 116/33 cm/sec

CCA: 93/17 cm/sec

SYSTOLIC ICA/CCA RATIO:

ECA: 97 cm/sec

LEFT

ICA: 54/15 cm/sec

CCA: 117/26 cm/sec

SYSTOLIC ICA/CCA RATIO:

ECA: 73 cm/sec

RIGHT CAROTID ARTERY: There is a moderate amount of eccentric
echogenic plaque involving the origin and proximal aspects of the
right internal carotid artery (images 14 and 29), similar to the
7083 examination and again not resulting in elevated peak systolic
velocities within the interrogated course of the right internal
carotid artery to suggest a hemodynamically significant stenosis.

RIGHT VERTEBRAL ARTERY:  Antegrade flow

LEFT CAROTID ARTERY: There is no grayscale evidence of significant
intimal thickening or atherosclerotic plaque affecting the
interrogated portions of the left carotid system. There are no
elevated peak systolic velocities within the interrogated course of
the left internal carotid artery to suggest a hemodynamically
significant stenosis.

LEFT VERTEBRAL ARTERY:  Antegrade flow

Upper extremity blood pressures: RIGHT: 132/71 LEFT: 136/71
IMPRESSION: 1. Moderate amount of right-sided atherosclerotic plaque,
morphologically similar to the 7083 examination and again not
resulting in elevated peak systolic velocities within the right
internal carotid artery to suggest a hemodynamically significant
stenosis.
2. Normal sonographic evaluation of the left carotid system.

## 2022-08-08 MED ORDER — BUPROPION HCL ER (XL) 300 MG PO TB24: 300.0000 mg | ORAL_TABLET | Freq: Every day | ORAL | 1 refills | Status: DC

## 2022-08-08 MED ORDER — DULOXETINE HCL 60 MG PO CPEP: 60.0000 mg | ORAL_CAPSULE | Freq: Every day | ORAL | 1 refills | Status: DC

## 2022-08-08 NOTE — Progress Notes (Signed)
[name] [id number] [birth year] 72 y.o.  Subjective:   Patient ID:  Robert Lee is a 72 y.o. (DOB 05/17/1950) male.  Chief Complaint:  Chief Complaint  Patient presents with   Follow-up    Depression    HPI Robert Lee presents to the office today for follow-up of depression. He reports, "I feel good." He reports that he "needs to start taking better care of myself."  He would like to be more physically active. He is considering power walking and then some strength training. He repots that his appetite has been good. He reports that he "is starting to put on weight" for the first time in his lifetime.   Denies depressed mood. Occasional sadness in response to preparing for retirement. He reports concern, "that I am letting other people down."  He reports that his energy and motivation "are not good. I would rather be outside in my yard than sitting behind my desk." He reports that he is more tired than he would like. He reports that he goes to bed early. He reports "too much sleep, probably." He reports that he no longer has the drive to get up at 5 am. He has been getting up around 6-6:30 am. He reports that his concentration and focus is "not good." He reports long-standing difficulty with concentration. Denies diminished interest or enjoyment. Denies SI.   He reports that he is preparing to retire. He is contemplating retirement at the end of the year.   Oldest granddaughter is graduating from [employer] next month. He has a 43 yo granddaughter and a 67 yo grandson.   Past Psychiatric Medication Trials: Wellbutrin XL- Significant improvement Cymbalta Reports taking another anti-depressant that was not effective and did not have any side effects   PHQ2-9    Flowsheet Row Clinical Support from 03/25/2022 in [facility] at [facility] Office Visit from 11/04/2021 in [facility] Primary Care at [facility] Clinical Support from 02/12/2021  in [facility] Primary Care at [facility] Office Visit from 08/13/2020 in [facility] Primary Care at [place]  PHQ-2 Total Score 0 1 0 0  PHQ-9 Total Score -- 2 -- --        Review of Systems:  Review of Systems  Musculoskeletal:  Negative for gait problem.  Neurological:  Negative for tremors.  Psychiatric/Behavioral:         Please refer to HPI    Medications: I have reviewed the patient's current medications.  Current Outpatient Medications  Medication Sig Dispense Refill   aspirin 81 MG EC tablet Take by mouth.     buPROPion (WELLBUTRIN XL) 300 MG 24 hr tablet Take 1 tablet (300 mg total) by mouth daily. 90 tablet 1   diphenhydrAMINE (SIMPLY SLEEP) 25 MG tablet Take 25 mg by mouth at bedtime as needed for sleep.     DULoxetine (CYMBALTA) 60 MG capsule Take 1 capsule (60 mg total) by mouth daily. 90 capsule 1   Omega 3 1200 MG CAPS Take by mouth.     rosuvastatin (CRESTOR) 10 MG tablet Take 1 tablet (10 mg total) by mouth daily. 90 tablet 1   tamsulosin (FLOMAX) 0.4 MG CAPS capsule Take 1 capsule (0.4 mg total) by mouth daily. 90 capsule 1   valACYclovir (VALTREX) 1000 MG tablet TAKE TWO TABLETS BY MOUTH AT ONSET, THEN TAKE TWO TABLETS 12 HOURS LATER AS DIRECTED     No current facility-administered  medications for this visit.    Medication Side Effects: None  Allergies: No Known Allergies  Past Medical History:  Diagnosis Date   Depression 11 years ago    Past Medical History, Surgical history, Social history, and Family history were reviewed and updated as appropriate.   Please see review of systems for further details on the patient's review from today.   Objective:   Physical Exam:  Wt 175 lb (79.4 kg)   BMI 25.11 kg/m   Physical Exam Constitutional:      General: He is not in acute distress. Musculoskeletal:        General: No deformity.  Neurological:     Mental Status: He is alert and oriented to person, place, and  time.     Coordination: Coordination normal.  Psychiatric:        Attention and Perception: Attention and perception normal. He does not perceive auditory or visual hallucinations.        Mood and Affect: Mood normal. Mood is not anxious or depressed. Affect is not labile, blunt, angry or inappropriate.        Speech: Speech normal.        Behavior: Behavior normal.        Thought Content: Thought content normal. Thought content is not paranoid or delusional. Thought content does not include homicidal or suicidal ideation. Thought content does not include homicidal or suicidal plan.        Cognition and Memory: Cognition and memory normal.        Judgment: Judgment normal.     Comments: Insight intact     Lab Review:     Component Value Date/Time   NA 140 11/04/2021 1116   K 4.5 11/04/2021 1116   CL 104 11/04/2021 1116   CO2 29 11/04/2021 1116   GLUCOSE 99 11/04/2021 1116   BUN 18 11/04/2021 1116   CREATININE 1.24 11/04/2021 1116   CALCIUM 9.3 11/04/2021 1116   PROT 6.6 11/04/2021 1116   ALBUMIN 4.5 11/04/2021 1116   AST 18 11/04/2021 1116   ALT 14 11/04/2021 1116   ALKPHOS 56 11/04/2021 1116   BILITOT 0.8 11/04/2021 1116       Component Value Date/Time   WBC 3.8 (L) 11/04/2021 1116   RBC 4.59 11/04/2021 1116   HGB 13.5 11/04/2021 1116   HCT 41.3 11/04/2021 1116   PLT 230.0 11/04/2021 1116   MCV 89.8 11/04/2021 1116   MCHC 32.8 11/04/2021 1116   RDW 14.4 11/04/2021 1116    No results found for: "POCLITH", "LITHIUM"   No results found for: "PHENYTOIN", "PHENOBARB", "VALPROATE", "CBMZ"   .res Assessment: Plan:   Will continue current medications without changes since pt reports that overall mood anxiety anxiety symptoms are well controlled. He reports some recent changes in energy that may be related to other factors. Discussed signs of worsening depression and pt reports that he will contact office if he experiences depressive symptoms.  Continue Wellbutrin XL 300  mg po qd for depression.  Continue Cymbalta 60 mg po qd for anxiety and depression.  Patient advised to contact office with any questions, adverse effects, or acute worsening in signs and symptoms. Pt to follow-up in 6 months or sooner if clinically indicated.   I spent 30 minutes dedicated to the care of this patient on the date of this  encounter to include pre-visit review of records, face-to-face time with the patient discussing the effects recent adjustments have had on his mood, ordering of medication,  and post visit documentation.   Robert Lee was seen today for follow-up.  Diagnoses and all orders for this visit:  Recurrent major depressive disorder, in full remission (HCC) -     buPROPion (WELLBUTRIN XL) 300 MG 24 hr tablet; Take 1 tablet (300 mg total) by mouth daily. -     DULoxetine (CYMBALTA) 60 MG capsule; Take 1 capsule (60 mg total) by mouth daily.  Anxiety disorder, unspecified type -     DULoxetine (CYMBALTA) 60 MG capsule; Take 1 capsule (60 mg total) by mouth daily.     Please see After Visit Summary for patient specific instructions.  Future Appointments  Date Time Provider Department Center  02/06/2023  8:30 AM Corie Chiquito, PMHNP [facility] None    No orders of the defined types were placed in this encounter.   -------------------------------

## 2022-10-03 ENCOUNTER — Other Ambulatory Visit: Payer: Self-pay

## 2022-10-03 MED ORDER — ROSUVASTATIN CALCIUM 10 MG PO TABS: 10.0000 mg | ORAL_TABLET | Freq: Every day | ORAL | 0 refills | Status: DC

## 2022-10-22 NOTE — Patient Instructions (Addendum)
It was great to see you again today!  I will be in touch with your labs Tetanus booster today Recommend that you get the shingles series at your pharmacy if not done already, at your convenience  We can try taking 0.8 mg of flomax and see if this improves your symptoms at all- if no improvement can go back to 0.4 mg  We did your one time hepatitis C screening today- recommended for all "baby boomers" to pick up undetected hepatitis C   Keep up the good work with exercise   Assuming all is well we can plan to visit in about one year

## 2022-10-22 NOTE — Progress Notes (Signed)
[facility] [address], [address] [place], [place] [address] [phone]  Date:  10/27/2022   Name:  Robert Lee   DOB:  [birth year]   MRN:  [id number]  PCP:  Pearline Cables, MD    Chief Complaint: Medication Refill (Concerns/ questions: pt would like to make sure A1C is collected today/Hep C screen due/Shingrix, tdap due- Medicare pt/)   History of Present Illness:  Robert Lee is a 72 y.o. very pleasant male patient who presents with the following:  Pt seen today for periodic recheck Last visit with myself a year ago  He is still working- he is in the [employer] business Married to Louin, they have 2 grown children and 3 grands  He is from [place], has lived in this area 45 years  No history of CAD He has done a couple of stress tests in the past due to chest pain Turned out to be costochondritis  Most recent stress test approx 2 years ago- he did fine  He does not drink alcohol or smoke Not getting a lot of exercise right now He had typically enjoying horseback riding-  His horse passed away not long ago- he was 72 years old He does travel quite a bit for his work  His father had a CVA from a carotid- pt has screening on a regular basis, his next ultrasound is coming up  His grandchildren range from age 35 to age 91 Shingrix- recommended  Covid booster Tetanus booster-this is due, most recent tetanus 10 years ago Carotid US- most recent 7/22.  Pt likes to do these every few years due to family history  Colon due 2025 He notes he is feeling well, getting plenty of exercise, no problems with chest pain or shortness of breath  Labs done on year ago   His father had DM -patient has history of prediabetes despite normal weight  Wt Readings from Last 3 Encounters:  10/27/22 173 lb 6.4 oz (78.7 kg)  11/04/21 172 lb 9.6 oz (78.3 kg)  02/12/21 166 lb 12.8 oz (75.7 kg)      Patient Active  Problem List   Diagnosis Date Noted   Family history of carotid artery stenosis 08/13/2020   Prediabetes 08/13/2020   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    Past Medical History:  Diagnosis Date   Depression 11 years ago    Past Surgical History:  Procedure Laterality Date   OTHER SURGICAL HISTORY  1968-1969   scull depression surgery    TONSILLECTOMY      Social History   Tobacco Use   Smoking status: Never   Smokeless tobacco: Never  Substance Use Topics   Alcohol use: Not Currently    Comment: No use in over 20-30 years   Drug use: Never    Family History  Problem Relation Age of Onset   Depression Mother    ADD / ADHD Mother    OCD Mother    Stroke Father    Diabetes Father    Drug abuse Paternal Grandmother    OCD Daughter    ADD / ADHD Son     No Known Allergies  Medication list has been reviewed and updated.  Current Outpatient Medications on File Prior to Visit  Medication Sig Dispense Refill   aspirin 81 MG EC tablet Take by mouth.     diphenhydrAMINE (SIMPLY SLEEP) 25 MG tablet Take 25 mg  by mouth at bedtime as needed for sleep.     Omega 3 1200 MG CAPS Take by mouth.     valACYclovir (VALTREX) 1000 MG tablet TAKE TWO TABLETS BY MOUTH AT ONSET, THEN TAKE TWO TABLETS 12 HOURS LATER AS DIRECTED     No current facility-administered medications on file prior to visit.    Review of Systems:  As per HPI- otherwise negative.  Physical Examination: Vitals:   10/27/22 1429  BP: (!) 142/80  Pulse: 82  Resp: 18  Temp: 97.9 F (36.6 C)  SpO2: 97%   Vitals:   10/27/22 1429  Weight: 173 lb 6.4 oz (78.7 kg)  Height: 5\' 10"  (1.778 m)   Body mass index is 24.88 kg/m. Ideal Body Weight: Weight in (lb) to have BMI = 25: 173.9  GEN: no acute distress.  Normal weight, looks well HEENT: Atraumatic, Normocephalic.  Hard of hearing, wears hearing aids, Bilateral TM wnl, oropharynx normal.  PEERL,EOMI.   Ears and Nose: No  external deformity. CV: RRR, No M/G/R. No JVD. No thrill. No extra heart sounds. PULM: CTA B, no wheezes, crackles, rhonchi. No retractions. No resp. distress. No accessory muscle use. ABD: S, NT, ND. No rebound. No HSM. EXTR: No c/c/e PSYCH: Normally interactive. Conversant.  Small wound right elbow which has scabbed- tetanus is overdue.  Will update today-no further wound care needed  Assessment and Plan: Lumbar degenerative disc disease  Screening for prostate cancer - Plan: PSA, Medicare (  [facility] only)  Prediabetes - Plan: Comprehensive metabolic panel, Hemoglobin A1c  Dyslipidemia - Plan: Lipid panel, rosuvastatin (CRESTOR) 10 MG tablet  Family history of carotid artery stenosis  Screening for deficiency anemia - Plan: CBC  Encounter for hepatitis C screening test for low risk patient - Plan: Hepatitis C Antibody  BPH with obstruction/lower urinary tract symptoms - Plan: tamsulosin (FLOMAX) 0.4 MG CAPS capsule  Recurrent major depressive disorder, in full remission (HCC) - Plan: buPROPion (WELLBUTRIN XL) 300 MG 24 hr tablet, DULoxetine (CYMBALTA) 60 MG capsule  Anxiety disorder, unspecified type - Plan: DULoxetine (CYMBALTA) 60 MG capsule  Immunization due - Plan: Td vaccine greater than or equal to 7yo preservative free IM  Medication monitoring encounter - Plan: CBC  Patient seen today for follow-up.  He notes some continued symptoms of BPH, we will have him try increasing Flomax to 0.8 mg.  If this is not helping we can try another agent such as daily Cialis Follow-up on routine blood work as above He is doing well with Wellbutrin and Cymbalta, refilled Update routine tetanus today Signed Abbe Amsterdam, MD   Addnd 7/23- received labs as below, message to pt  Results for orders placed or performed in visit on 10/27/22  CBC  Result Value Ref Range   WBC 5.9 4.0 - 10.5 K/uL   RBC 4.73 4.22 - 5.81 Mil/uL   Platelets 271.0 150.0 - 400.0 K/uL   Hemoglobin  14.2 13.0 - 17.0 g/dL   HCT 78.2 95.6 - 21.3 %   MCV 92.8 78.0 - 100.0 fl   MCHC 32.4 30.0 - 36.0 g/dL   RDW 08.6 57.8 - 46.9 %  Comprehensive metabolic panel  Result Value Ref Range   Sodium 139 135 - 145 mEq/L   Potassium 4.4 3.5 - 5.1 mEq/L   Chloride 101 96 - 112 mEq/L   CO2 28 19 - 32 mEq/L   Glucose, Bld 89 70 - 99 mg/dL   BUN 20 6 - 23 mg/dL   Creatinine,  Ser 1.23 0.40 - 1.50 mg/dL   Total Bilirubin 0.9 0.2 - 1.2 mg/dL   Alkaline Phosphatase 65 39 - 117 U/L   AST 18 0 - 37 U/L   ALT 15 0 - 53 U/L   Total Protein 6.7 6.0 - 8.3 g/dL   Albumin 4.5 3.5 - 5.2 g/dL   GFR 65.78 (L) >46.96 mL/min   Calcium 9.6 8.4 - 10.5 mg/dL  Hemoglobin E9B  Result Value Ref Range   Hgb A1c MFr Bld 6.2 4.6 - 6.5 %  Lipid panel  Result Value Ref Range   Cholesterol 178 0 - 200 mg/dL   Triglycerides 28.4 0.0 - 149.0 mg/dL   HDL 13.24 >40.10 mg/dL   VLDL 27.2 0.0 - 53.6 mg/dL   LDL Cholesterol 644 (H) 0 - 99 mg/dL   Total CHOL/HDL Ratio 3    NonHDL 114.49   PSA, Medicare ( [facility] only)  Result Value Ref Range   PSA 3.61 0.10 - 4.00 ng/ml

## 2022-10-23 ENCOUNTER — Encounter: Payer: Self-pay | Admitting: Psychiatry

## 2022-10-27 ENCOUNTER — Ambulatory Visit (INDEPENDENT_AMBULATORY_CARE_PROVIDER_SITE_OTHER): Payer: Medicare Other | Admitting: Family Medicine

## 2022-10-27 ENCOUNTER — Encounter: Payer: Self-pay | Admitting: Family Medicine

## 2022-10-27 VITALS — BP 130/80 | HR 82 | Temp 97.9°F | Resp 18 | Ht 70.0 in | Wt 173.4 lb

## 2022-10-27 DIAGNOSIS — F419 Anxiety disorder, unspecified: Secondary | ICD-10-CM

## 2022-10-27 DIAGNOSIS — N401 Enlarged prostate with lower urinary tract symptoms: Secondary | ICD-10-CM

## 2022-10-27 DIAGNOSIS — N138 Other obstructive and reflux uropathy: Secondary | ICD-10-CM

## 2022-10-27 DIAGNOSIS — F3342 Major depressive disorder, recurrent, in full remission: Secondary | ICD-10-CM

## 2022-10-27 DIAGNOSIS — M51369 Other intervertebral disc degeneration, lumbar region without mention of lumbar back pain or lower extremity pain: Secondary | ICD-10-CM

## 2022-10-27 DIAGNOSIS — Z125 Encounter for screening for malignant neoplasm of prostate: Secondary | ICD-10-CM | POA: Diagnosis not present

## 2022-10-27 DIAGNOSIS — E785 Hyperlipidemia, unspecified: Secondary | ICD-10-CM | POA: Diagnosis not present

## 2022-10-27 DIAGNOSIS — Z1159 Encounter for screening for other viral diseases: Secondary | ICD-10-CM

## 2022-10-27 DIAGNOSIS — Z130 Encounter for screening for diseases of the blood and blood-forming organs and certain disorders involving the immune mechanism: Secondary | ICD-10-CM

## 2022-10-27 DIAGNOSIS — R7303 Prediabetes: Secondary | ICD-10-CM

## 2022-10-27 DIAGNOSIS — M5136 Other intervertebral disc degeneration, lumbar region: Secondary | ICD-10-CM

## 2022-10-27 DIAGNOSIS — Z5181 Encounter for therapeutic drug level monitoring: Secondary | ICD-10-CM | POA: Diagnosis not present

## 2022-10-27 DIAGNOSIS — Z23 Encounter for immunization: Secondary | ICD-10-CM

## 2022-10-27 DIAGNOSIS — Z8249 Family history of ischemic heart disease and other diseases of the circulatory system: Secondary | ICD-10-CM

## 2022-10-27 MED ORDER — DULOXETINE HCL 60 MG PO CPEP: 60.0000 mg | ORAL_CAPSULE | Freq: Every day | ORAL | 3 refills | Status: DC

## 2022-10-27 MED ORDER — BUPROPION HCL ER (XL) 300 MG PO TB24: 300.0000 mg | ORAL_TABLET | Freq: Every day | ORAL | 3 refills | Status: DC

## 2022-10-27 MED ORDER — ROSUVASTATIN CALCIUM 10 MG PO TABS: 10.0000 mg | ORAL_TABLET | Freq: Every day | ORAL | 3 refills | Status: DC

## 2022-10-27 MED ORDER — TAMSULOSIN HCL 0.4 MG PO CAPS: 0.8000 mg | ORAL_CAPSULE | Freq: Every day | ORAL | 3 refills | Status: DC

## 2022-10-28 ENCOUNTER — Encounter: Payer: Self-pay | Admitting: Family Medicine

## 2022-10-28 LAB — CBC
HCT: 43.9 % (ref 39.0–52.0)
Hemoglobin: 14.2 g/dL (ref 13.0–17.0)
MCHC: 32.4 g/dL (ref 30.0–36.0)
MCV: 92.8 fl (ref 78.0–100.0)
Platelets: 271 10*3/uL (ref 150.0–400.0)
RBC: 4.73 Mil/uL (ref 4.22–5.81)
RDW: 14.1 % (ref 11.5–15.5)
WBC: 5.9 10*3/uL (ref 4.0–10.5)

## 2022-10-28 LAB — COMPREHENSIVE METABOLIC PANEL
ALT: 15 U/L (ref 0–53)
AST: 18 U/L (ref 0–37)
Albumin: 4.5 g/dL (ref 3.5–5.2)
Alkaline Phosphatase: 65 U/L (ref 39–117)
BUN: 20 mg/dL (ref 6–23)
CO2: 28 mEq/L (ref 19–32)
Calcium: 9.6 mg/dL (ref 8.4–10.5)
Chloride: 101 mEq/L (ref 96–112)
Creatinine, Ser: 1.23 mg/dL (ref 0.40–1.50)
GFR: 58.62 mL/min — ABNORMAL LOW (ref >60.00)
Glucose, Bld: 89 mg/dL (ref 70–99)
Potassium: 4.4 mEq/L (ref 3.5–5.1)
Sodium: 139 mEq/L (ref 135–145)
Total Bilirubin: 0.9 mg/dL (ref 0.2–1.2)
Total Protein: 6.7 g/dL (ref 6.0–8.3)

## 2022-10-28 LAB — LIPID PANEL
Cholesterol: 178 mg/dL (ref 0–200)
HDL: 63.4 mg/dL (ref >39.00)
LDL Cholesterol: 100 mg/dL — ABNORMAL HIGH (ref 0–99)
NonHDL: 114.49
Total CHOL/HDL Ratio: 3
Triglycerides: 73 mg/dL (ref 0.0–149.0)
VLDL: 14.6 mg/dL (ref 0.0–40.0)

## 2022-10-28 LAB — PSA, MEDICARE: PSA: 3.61 ng/ml (ref 0.10–4.00)

## 2022-10-28 LAB — HEMOGLOBIN A1C: Hgb A1c MFr Bld: 6.2 % (ref 4.6–6.5)

## 2022-10-28 LAB — HEPATITIS C ANTIBODY: Hepatitis C Ab: NONREACTIVE

## 2022-11-12 ENCOUNTER — Encounter: Payer: Self-pay | Admitting: Family Medicine

## 2022-12-23 ENCOUNTER — Other Ambulatory Visit: Payer: Self-pay | Admitting: Physician Assistant

## 2022-12-23 ENCOUNTER — Ambulatory Visit (INDEPENDENT_AMBULATORY_CARE_PROVIDER_SITE_OTHER): Payer: Medicare Other | Admitting: Physician Assistant

## 2022-12-23 ENCOUNTER — Encounter: Payer: Self-pay | Admitting: Physician Assistant

## 2022-12-23 VITALS — BP 136/74 | HR 72 | Temp 97.9°F | Resp 20 | Wt 168.0 lb

## 2022-12-23 DIAGNOSIS — D649 Anemia, unspecified: Secondary | ICD-10-CM

## 2022-12-23 DIAGNOSIS — R42 Dizziness and giddiness: Secondary | ICD-10-CM

## 2022-12-23 DIAGNOSIS — R079 Chest pain, unspecified: Secondary | ICD-10-CM | POA: Diagnosis not present

## 2022-12-23 LAB — CBC WITH DIFFERENTIAL/PLATELET
Basophils Absolute: 0 10*3/uL (ref 0.0–0.1)
Basophils Relative: 1 % (ref 0.0–3.0)
Eosinophils Absolute: 0.2 10*3/uL (ref 0.0–0.7)
Eosinophils Relative: 5.3 % — ABNORMAL HIGH (ref 0.0–5.0)
HCT: 38.7 % — ABNORMAL LOW (ref 39.0–52.0)
Hemoglobin: 12.6 g/dL — ABNORMAL LOW (ref 13.0–17.0)
Lymphocytes Relative: 31.2 % (ref 12.0–46.0)
Lymphs Abs: 1.4 10*3/uL (ref 0.7–4.0)
MCHC: 32.7 g/dL (ref 30.0–36.0)
MCV: 90.9 fl (ref 78.0–100.0)
Monocytes Absolute: 0.5 10*3/uL (ref 0.1–1.0)
Monocytes Relative: 10.9 % (ref 3.0–12.0)
Neutro Abs: 2.3 10*3/uL (ref 1.4–7.7)
Neutrophils Relative %: 51.6 % (ref 43.0–77.0)
Platelets: 265 10*3/uL (ref 150.0–400.0)
RBC: 4.26 Mil/uL (ref 4.22–5.81)
RDW: 13.1 % (ref 11.5–15.5)
WBC: 4.4 10*3/uL (ref 4.0–10.5)

## 2022-12-23 LAB — COMPREHENSIVE METABOLIC PANEL
ALT: 16 U/L (ref 0–53)
AST: 17 U/L (ref 0–37)
Albumin: 4.1 g/dL (ref 3.5–5.2)
Alkaline Phosphatase: 66 U/L (ref 39–117)
BUN: 17 mg/dL (ref 6–23)
CO2: 29 meq/L (ref 19–32)
Calcium: 8.9 mg/dL (ref 8.4–10.5)
Chloride: 103 meq/L (ref 96–112)
Creatinine, Ser: 1.2 mg/dL (ref 0.40–1.50)
GFR: 60.32 mL/min (ref >60.00)
Glucose, Bld: 106 mg/dL — ABNORMAL HIGH (ref 70–99)
Potassium: 4.2 meq/L (ref 3.5–5.1)
Sodium: 139 meq/L (ref 135–145)
Total Bilirubin: 0.7 mg/dL (ref 0.2–1.2)
Total Protein: 6.3 g/dL (ref 6.0–8.3)

## 2022-12-23 LAB — POCT URINALYSIS DIP (MANUAL ENTRY)
Bilirubin, UA: NEGATIVE
Blood, UA: NEGATIVE
Glucose, UA: NEGATIVE mg/dL
Ketones, POC UA: NEGATIVE mg/dL
Leukocytes, UA: NEGATIVE
Nitrite, UA: NEGATIVE
Protein Ur, POC: NEGATIVE mg/dL
Spec Grav, UA: 1.01 (ref 1.010–1.025)
Urobilinogen, UA: 0.2 U/dL — NL
pH, UA: 6 (ref 5.0–8.0)

## 2022-12-23 NOTE — Progress Notes (Signed)
Established patient visit   Patient: Robert Lee   DOB: 11-07-1950   72 y.o. Male  MRN: [id number] Visit Date: 12/23/2022  Today's healthcare provider: Alfredia Ferguson, PA-C   Chief Complaint  Patient presents with   Dizziness    Dizziness without any falls for a couple a weeks    Chest Pain    More tightness than pain with shortness of breath    Subjective    HPI Pt reports for the past several weeks having some dizzy spells- notably when he goes from sitting/laying to standing, unsteadiness, lasts a few seconds and resolves. He also reports a few episodes of dizziness when working out in the yard.   Reports central chest tightness for the last few days, some intermittent shortness of breath, fatigue. Denies heartburn symptoms, denies positional symptoms. No history of asthma or copd.   Medications: Outpatient Medications Prior to Visit  Medication Sig   aspirin 81 MG EC tablet Take by mouth.   buPROPion (WELLBUTRIN XL) 300 MG 24 hr tablet Take 1 tablet (300 mg total) by mouth daily.   diphenhydrAMINE (SIMPLY SLEEP) 25 MG tablet Take 25 mg by mouth at bedtime as needed for sleep.   DULoxetine (CYMBALTA) 60 MG capsule Take 1 capsule (60 mg total) by mouth daily.   Omega 3 1200 MG CAPS Take by mouth.   rosuvastatin (CRESTOR) 10 MG tablet Take 1 tablet (10 mg total) by mouth daily.   tamsulosin (FLOMAX) 0.4 MG CAPS capsule Take 2 capsules (0.8 mg total) by mouth daily.   valACYclovir (VALTREX) 1000 MG tablet TAKE TWO TABLETS BY MOUTH AT ONSET, THEN TAKE TWO TABLETS 12 HOURS LATER AS DIRECTED   No facility-administered medications prior to visit.    Review of Systems  Constitutional:  Positive for fatigue. Negative for fever.  Respiratory:  Positive for chest tightness and shortness of breath. Negative for cough.   Cardiovascular:  Negative for chest pain, palpitations and leg swelling.  Neurological:  Positive for dizziness. Negative for headaches.      Objective     BP 136/74 (BP Location: Left Arm, Patient Position: Sitting, Cuff Size: Normal)   Pulse 72   Temp 97.9 F (36.6 C) (Oral)   Resp 20   Wt 168 lb (76.2 kg)   SpO2 99%   BMI 24.11 kg/m   Physical Exam Constitutional:      General: He is awake.     Appearance: He is well-developed.  HENT:     Head: Normocephalic.  Eyes:     Conjunctiva/sclera: Conjunctivae normal.  Neck:     Vascular: No carotid bruit.  Cardiovascular:     Rate and Rhythm: Normal rate and regular rhythm.     Heart sounds: Normal heart sounds.  Pulmonary:     Effort: Pulmonary effort is normal.     Breath sounds: Normal breath sounds.  Skin:    General: Skin is warm.  Neurological:     Mental Status: He is alert and oriented to person, place, and time.  Psychiatric:        Attention and Perception: Attention normal.        Mood and Affect: Mood normal.        Speech: Speech normal.        Behavior: Behavior is cooperative.      Results for orders placed or performed in visit on 12/23/22  POCT urinalysis dipstick  Result Value Ref Range   Color, UA yellow yellow  Clarity, UA clear clear   Glucose, UA negative negative mg/dL   Bilirubin, UA negative negative   Ketones, POC UA negative negative mg/dL   Spec Grav, UA 1.610 9.604 - 1.025   Blood, UA negative negative   pH, UA 6.0 5.0 - 8.0   Protein Ur, POC negative negative mg/dL   Urobilinogen, UA 0.2 0.2 or 1.0 E.U./dL   Nitrite, UA Negative Negative   Leukocytes, UA Negative Negative    Assessment & Plan     1. Chest pain, unspecified type EKG normal sinus. No st elevation or t wave abnormalities If associated with lying flat-- after meals, suggested trial of TUMS to r/o GERD  - EKG 12-Lead - Ambulatory referral to Cardiology  2. Dizziness UA negative. Advised pt to monitor symptoms for association with lying down, meals, dehydration.  Ref to cardiology for holter monitor.   - CBC w/Diff - Comp Met (CMET) - POCT urinalysis  dipstick - Ambulatory referral to Cardiology   Return if symptoms worsen or fail to improve.      I, Alfredia Ferguson, PA-C have reviewed all documentation for this visit. The documentation on  12/23/22   for the exam, diagnosis, procedures, and orders are all accurate and complete.    Alfredia Ferguson, PA-C  [facility] at [facility] [phone] (phone) [phone] (fax)  [facility]

## 2023-02-02 ENCOUNTER — Other Ambulatory Visit: Payer: Self-pay

## 2023-02-02 DIAGNOSIS — F32A Depression, unspecified: Secondary | ICD-10-CM | POA: Insufficient documentation

## 2023-02-03 ENCOUNTER — Other Ambulatory Visit: Payer: Self-pay | Admitting: Physician Assistant

## 2023-02-03 ENCOUNTER — Ambulatory Visit: Payer: Medicare Other

## 2023-02-03 ENCOUNTER — Other Ambulatory Visit (INDEPENDENT_AMBULATORY_CARE_PROVIDER_SITE_OTHER): Payer: Medicare Other

## 2023-02-03 VITALS — BP 130/66 | HR 71 | Ht 70.0 in | Wt 171.0 lb

## 2023-02-03 DIAGNOSIS — I351 Nonrheumatic aortic (valve) insufficiency: Secondary | ICD-10-CM | POA: Diagnosis present

## 2023-02-03 DIAGNOSIS — R42 Dizziness and giddiness: Secondary | ICD-10-CM

## 2023-02-03 DIAGNOSIS — R0609 Other forms of dyspnea: Secondary | ICD-10-CM

## 2023-02-03 DIAGNOSIS — D649 Anemia, unspecified: Secondary | ICD-10-CM

## 2023-02-03 DIAGNOSIS — I350 Nonrheumatic aortic (valve) stenosis: Secondary | ICD-10-CM | POA: Insufficient documentation

## 2023-02-03 DIAGNOSIS — E785 Hyperlipidemia, unspecified: Secondary | ICD-10-CM | POA: Diagnosis not present

## 2023-02-03 HISTORY — DX: Dizziness and giddiness: R42

## 2023-02-03 HISTORY — DX: Other forms of dyspnea: R06.09

## 2023-02-03 HISTORY — DX: Nonrheumatic aortic (valve) insufficiency: I35.1

## 2023-02-03 HISTORY — DX: Nonrheumatic aortic (valve) stenosis: I35.0

## 2023-02-03 LAB — CBC WITH DIFFERENTIAL/PLATELET
Basophils Absolute: 0 10*3/uL (ref 0.0–0.1)
Basophils Relative: 0.7 % (ref 0.0–3.0)
Eosinophils Absolute: 0.2 10*3/uL (ref 0.0–0.7)
Eosinophils Relative: 3.3 % (ref 0.0–5.0)
HCT: 40.5 % (ref 39.0–52.0)
Hemoglobin: 12.9 g/dL — ABNORMAL LOW (ref 13.0–17.0)
Lymphocytes Relative: 23.9 % (ref 12.0–46.0)
Lymphs Abs: 1.5 10*3/uL (ref 0.7–4.0)
MCHC: 31.9 g/dL (ref 30.0–36.0)
MCV: 90.8 fL (ref 78.0–100.0)
Monocytes Absolute: 0.7 10*3/uL (ref 0.1–1.0)
Monocytes Relative: 12 % (ref 3.0–12.0)
Neutro Abs: 3.7 10*3/uL (ref 1.4–7.7)
Neutrophils Relative %: 60.1 % (ref 43.0–77.0)
Platelets: 246 10*3/uL (ref 150.0–400.0)
RBC: 4.46 Mil/uL (ref 4.22–5.81)
RDW: 14.2 % (ref 11.5–15.5)
WBC: 6.1 10*3/uL (ref 4.0–10.5)

## 2023-02-03 LAB — IBC + FERRITIN
Ferritin: 7.2 ng/mL — ABNORMAL LOW (ref 22.0–322.0)
Iron: 76 ug/dL (ref 42–165)
Saturation Ratios: 17.9 % — ABNORMAL LOW (ref 20.0–50.0)
TIBC: 424.2 ug/dL (ref 250.0–450.0)
Transferrin: 303 mg/dL (ref 212.0–360.0)

## 2023-02-03 LAB — VITAMIN B12: Vitamin B-12: 253 pg/mL (ref 211–911)

## 2023-02-03 MED ORDER — METOPROLOL TARTRATE 100 MG PO TABS: 100.0000 mg | ORAL_TABLET | Freq: Once | ORAL | 0 refills | Status: DC

## 2023-02-03 NOTE — Assessment & Plan Note (Signed)
Symptoms are mostly with moderate and higher levels of exertion.  Relieved with rest.  Mildly progressive over the past year.  Does have cardiovascular risk factors in form of age, hyperlipidemia, evidence of coronary and aortic atherosclerosis noted on prior CT imaging.  Reviewed further evaluation for any significant underlying coronary artery disease. Will proceed with CT coronary angiogram.  No dye allergy noted.  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 Will prescribe metoprolol tartrate 100 mg to be taken on the morning of the test to help optimize heart rates for imaging.  Further plan to escalate management and need for any intervention will be based on these results.

## 2023-02-03 NOTE — Patient Instructions (Signed)
Medication Instructions:  Your physician recommends that you continue on your current medications as directed. Please refer to the Current Medication list given to you today.   *If you need a refill on your cardiac medications before your next appointment, please call your pharmacy*   Lab Work: Your physician recommends that you have a BMP today in the office.   If you have labs (blood work) drawn today and your tests are completely normal, you will receive your results only by: MyChart Message (if you have MyChart) OR A paper copy in the mail If you have any lab test that is abnormal or we need to change your treatment, we will call you to review the results.   Testing/Procedures:   Your cardiac CT will be scheduled at one of the below locations:   [facility] at [facility] [address], [address] [place],  [place]  [address]  Get Driving Directions Main: [phone]  Please follow these instructions carefully (unless otherwise directed):  Hold all erectile dysfunction medications at least 3 days (72 hrs) prior to test. (Ie viagra, cialis, sildenafil, tadalafil, etc) We will administer nitroglycerin during this exam.   On the Night Before the Test: Be sure to Drink plenty of water. Do not consume any caffeinated/decaffeinated beverages or chocolate 12 hours prior to your test. Do not take any antihistamines 12 hours prior to your test.  On the Day of the Test: Drink plenty of water until 1 hour prior to the test. Do not eat any food 1 hour prior to test. You may take your regular medications prior to the test.  Take metoprolol (Lopressor) two hours prior to test. This will be a one time dose. After the Test: Drink plenty of water. After receiving IV contrast, you may experience a mild flushed feeling. This is normal. On occasion, you may experience a mild rash up to 24 hours after the test. This is not dangerous. If this occurs, you can  take Benadryl 25 mg and increase your fluid intake. If you experience trouble breathing, this can be serious. If it is severe call 911 IMMEDIATELY. If it is mild, please call our office. If you take any of these medications: Glipizide/Metformin, Avandament, Glucavance, please do not take 48 hours after completing test unless otherwise instructed.  We will call to schedule your test 2-4 weeks out understanding that some insurance companies will need an authorization prior to the service being performed.   For non-scheduling related questions, please contact the cardiac imaging nurse navigator should you have any questions/concerns: Rockwell Alexandria, Cardiac Imaging Nurse Navigator Larey Brick, Cardiac Imaging Nurse Navigator [facility] Direct Office Dial: [phone]   For scheduling needs, including cancellations and rescheduling, please call [place], [phone].   Your physician has requested that you have an echocardiogram. Echocardiography is a painless test that uses sound waves to create images of your heart. It provides your doctor with information about the size and shape of your heart and how well your heart's chambers and valves are working. This procedure takes approximately one hour. There are no restrictions for this procedure. Please do NOT wear cologne, perfume, aftershave, or lotions (deodorant is allowed). Please arrive 15 minutes prior to your appointment time.   Your next appointment:   1 month(s)  The format for your next appointment:   In Person  Provider:   Huntley Dec, MD   Other Instructions Cardiac CT Angiogram A cardiac CT angiogram  is a procedure to look at the heart and the area around the heart. It may be done to help find the cause of chest pains or other symptoms of heart disease. During this procedure, a substance called contrast dye is injected into the blood vessels in the area to be checked. A large X-ray machine,  called a CT scanner, then takes detailed pictures of the heart and the surrounding area. The procedure is also sometimes called a coronary CT angiogram, coronary artery scanning, or CTA. A cardiac CT angiogram allows the health care provider to see how well blood is flowing to and from the heart. The health care provider will be able to see if there are any problems, such as: Blockage or narrowing of the coronary arteries in the heart. Fluid around the heart. Signs of weakness or disease in the muscles, valves, and tissues of the heart. Tell a health care provider about: Any allergies you have. This is especially important if you have had a previous allergic reaction to contrast dye. All medicines you are taking, including vitamins, herbs, eye drops, creams, and over-the-counter medicines. Any blood disorders you have. Any surgeries you have had. Any medical conditions you have. Whether you are pregnant or may be pregnant. Any anxiety disorders, chronic pain, or other conditions you have that may increase your stress or prevent you from lying still. What are the risks? Generally, this is a safe procedure. However, problems may occur, including: Bleeding. Infection. Allergic reactions to medicines or dyes. Damage to other structures or organs. Kidney damage from the contrast dye that is used. Increased risk of cancer from radiation exposure. This risk is low. Talk with your health care provider about: The risks and benefits of testing. How you can receive the lowest dose of radiation. What happens before the procedure? Wear comfortable clothing and remove any jewelry, glasses, dentures, and hearing aids. Follow instructions from your health care provider about eating and drinking. This may include: For 12 hours before the procedure -- avoid caffeine. This includes tea, coffee, soda, energy drinks, and diet pills. Drink plenty of water or other fluids that do not have caffeine in them. Being  well hydrated can prevent complications. For 4-6 hours before the procedure -- stop eating and drinking. The contrast dye can cause nausea, but this is less likely if your stomach is empty. Ask your health care provider about changing or stopping your regular medicines. This is especially important if you are taking diabetes medicines, blood thinners, or medicines to treat problems with erections (erectile dysfunction). What happens during the procedure?  Hair on your chest may need to be removed so that small sticky patches called electrodes can be placed on your chest. These will transmit information that helps to monitor your heart during the procedure. An IV will be inserted into one of your veins. You might be given a medicine to control your heart rate during the procedure. This will help to ensure that good images are obtained. You will be asked to lie on an exam table. This table will slide in and out of the CT machine during the procedure. Contrast dye will be injected into the IV. You might feel warm, or you may get a metallic taste in your mouth. You will be given a medicine called nitroglycerin. This will relax or dilate the arteries in your heart. The table that you are lying on will move into the CT machine tunnel for the scan. The person running the machine will give  you instructions while the scans are being done. You may be asked to: Keep your arms above your head. Hold your breath. Stay very still, even if the table is moving. When the scanning is complete, you will be moved out of the machine. The IV will be removed. The procedure may vary among health care providers and hospitals. What can I expect after the procedure? After your procedure, it is common to have: A metallic taste in your mouth from the contrast dye. A feeling of warmth. A headache from the nitroglycerin. Follow these instructions at home: Take over-the-counter and prescription medicines only as told by  your health care provider. If you are told, drink enough fluid to keep your urine pale yellow. This will help to flush the contrast dye out of your body. Most people can return to their normal activities right after the procedure. Ask your health care provider what activities are safe for you. It is up to you to get the results of your procedure. Ask your health care provider, or the department that is doing the procedure, when your results will be ready. Keep all follow-up visits as told by your health care provider. This is important. Contact a health care provider if: You have any symptoms of allergy to the contrast dye. These include: Shortness of breath. Rash or hives. A racing heartbeat. Summary A cardiac CT angiogram is a procedure to look at the heart and the area around the heart. It may be done to help find the cause of chest pains or other symptoms of heart disease. During this procedure, a large X-ray machine, called a CT scanner, takes detailed pictures of the heart and the surrounding area after a contrast dye has been injected into blood vessels in the area. Ask your health care provider about changing or stopping your regular medicines before the procedure. This is especially important if you are taking diabetes medicines, blood thinners, or medicines to treat erectile dysfunction. If you are told, drink enough fluid to keep your urine pale yellow. This will help to flush the contrast dye out of your body. This information is not intended to replace advice given to you by your health care provider. Make sure you discuss any questions you have with your health care provider. Document Revised: 11/17/2018 Document Reviewed: 11/17/2018 Elsevier Patient Education  2020 [employer].

## 2023-02-03 NOTE — Assessment & Plan Note (Signed)
Last lipid panel from July 2024 total cholesterol 178, triglycerides 73, HDL 63, LDL 100.  If the recurrent evidence of coronary atherosclerosis noted, would recommend LDL target below 70 mg/dL or even lower.  Continue rosuvastatin 10 mg along with omega-3 fatty acids at current doses.  Continue with aspirin 81 mg once daily which she has been taking chronically.  Will further decide on long-term necessity after CT coronary angiogram results.

## 2023-02-03 NOTE — Assessment & Plan Note (Signed)
Reviewed the diagnosis at length with him. Reviewed importance of regular monitoring. Will proceed with transthoracic echocardiogram

## 2023-02-03 NOTE — Progress Notes (Signed)
Cardiology Consultation:    Date:  02/03/2023   ID:  Robert Lee, DOB Oct 11, 1950, MRN [id number]  PCP:  Pearline Cables, MD  Cardiologist:  Marlyn Corporal Shone Leventhal, MD   Referring MD: Alfredia Ferguson, PA-C   No chief complaint on file. Chest pain  and dizziness   ASSESSMENT AND PLAN:   72 year old male patient with no significant prior coronary artery disease history. Has history of dyslipidemia, mild aortic stenosis, mild aortic insufficiency, mild carotid artery atherosclerosis and evidence of coronary and aortic atherosclerosis on prior CT chest imaging from 2019, now presenting with symptoms of dyspnea with moderate to heavy exertion, mildly progressive over the past year.  Problem List Items Addressed This Visit     Hyperlipidemia LDL goal <70    Last lipid panel from July 2024 total cholesterol 178, triglycerides 73, HDL 63, LDL 100.  If the recurrent evidence of coronary atherosclerosis noted, would recommend LDL target below 70 mg/dL or even lower.  Continue rosuvastatin 10 mg along with omega-3 fatty acids at current doses.  Continue with aspirin 81 mg once daily which she has been taking chronically.  Will further decide on long-term necessity after CT coronary angiogram results.      Relevant Medications   metoprolol tartrate (LOPRESSOR) 100 MG tablet   Dyspnea on exertion - Primary    Symptoms are mostly with moderate and higher levels of exertion.  Relieved with rest.  Mildly progressive over the past year.  Does have cardiovascular risk factors in form of age, hyperlipidemia, evidence of coronary and aortic atherosclerosis noted on prior CT imaging.  Reviewed further evaluation for any significant underlying coronary artery disease. Will proceed with CT coronary angiogram.  No dye allergy noted.  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 Will prescribe metoprolol tartrate 100 mg to be taken on the morning of the test to help optimize heart rates for imaging.   Further plan to escalate management and need for any intervention will be based on these results.        Relevant Medications   metoprolol tartrate (LOPRESSOR) 100 MG tablet   Other Relevant Orders   EKG 12-Lead (Completed)   Basic metabolic panel   CT CORONARY MORPH W/CTA COR W/SCORE W/CA W/CM &/OR WO/CM   Lightheadedness    Does not appear significant cardiac related as his symptoms occurred on a dry hot day with limited oral fluid intake.  No symptom recurrence.  He is aware to keep himself well-hydrated.      Mild aortic stenosis last transthoracic echocardiogram from September 2019 V-max 2.4 m/s, mean gradient 15 mmHg and associated mild aortic insufficiency    Reviewed the diagnosis at length with him. Reviewed importance of regular monitoring. Will proceed with transthoracic echocardiogram      Relevant Medications   metoprolol tartrate (LOPRESSOR) 100 MG tablet   Mild aortic insufficiency based on transthoracic echocardiogram report from September 2019.    Reviewed findings as above under aortic gnosis.  Transthoracic echocardiogram ordered.      Relevant Medications   metoprolol tartrate (LOPRESSOR) 100 MG tablet   Return to clinic in 1 month to review test results once available.   History of Present Illness:    Robert Lee is a 72 y.o. male who is being seen today for the evaluation of dizziness and chest pain at the request of Drubel, Lillia Abed, PA-C.  History of dyslipidemia, mild aortic stenosis [last echocardiogram from September 2019], mild aortic insufficiency, prediabetes, mild carotid  artery atherosclerosis without significant stenosis, mild anemia recently diagnosed.   Denies any further prior coronary artery disease history  Pleasant gentleman here for the visit by himself.  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  Referred by PCP for further evaluation of dyspnea on exertion with moderate to heavy intensity activity.  Resolves with  rest.  He does not notice any significant concerns with day-to-day activities.  Mild change in comparison to past year or so.  Reports an incidence of lightheadedness during monitor having exertion on a hot sunny day, having consumed very little fluid.  Symptoms resolved immediately with rest.  No near syncopal or syncopal episodes.  No palpitations.  No pedal edema.  No blood in urine or stools. No regular exercise but no limitation with day-to-day activities or activities around the house as needed.  Does not smoke or drink alcohol.  EKG in the clinic today shows sinus rhythm heart rate 71/min, PR interval 170 ms normal, QRS axis rightward, duration 96 ms.  Last lab work from 12-23-2022 noted sodium 139, potassium 4.2, BUN 17, creatinine 1.2, EGFR 60 suggesting CKD stage II Normal transaminases and alkaline phosphatase CBC with mild anemia hemoglobin 12.6, hematocrit 38.7, WBC 4.4, platelets 265 Last lipid panel to review is from July 2024 total cholesterol 178, triglycerides 73, HDL 63, LDL 100 Hemoglobin A1c 6.2 on October 27, 2022  Prior ultrasound of carotids from July 2022 noted moderate amount of plaque the right carotid artery system without significant narrowing on Doppler assessment.  February 2020 he had a stress echo at [facility], showed normal stress echocardiogram with no ischemia and good exercise capacity reported, exercise duration close to 7 minutes, attaining 11.6 METS  CTA chest done for PE protocol February 2020 noted mild coronary calcifications and aortic atherosclerosis.  Transthoracic echocardiogram from September 2019 reported normal LVEF 55 to 60%, mild aortic stenosis with V-max 2.4 m/s, mean gradient 15 mmHg, mild aortic insufficiency. 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  Mild AS and AI on echo.  Ejection fraction   55-60%     Prediabetes 08/13/2020    Past Surgical History:  Procedure Laterality Date   OTHER SURGICAL HISTORY  1968-1969   scull depression surgery    TONSILLECTOMY      Current Medications: Current Meds  Medication Sig   aspirin 81 MG EC tablet Take 81 mg by mouth daily.   buPROPion (WELLBUTRIN XL) 300 MG 24 hr tablet Take 300 mg by mouth daily.   diphenhydrAMINE (SIMPLY SLEEP) 25 MG tablet Take 25 mg by mouth at bedtime as needed for sleep.   DULoxetine (CYMBALTA) 60 MG capsule Take 1 capsule (60 mg total) by mouth daily.   metoprolol tartrate (LOPRESSOR) 100 MG tablet Take 1 tablet (100 mg total) by mouth once for 1 dose. Take 2 hours prior to your CT if your heart rate is greater than 55   Omega 3 1200 MG CAPS Take 1,200 mg by mouth daily.   rosuvastatin (CRESTOR) 10 MG tablet Take  1 tablet (10 mg total) by mouth daily.   tamsulosin (FLOMAX) 0.4 MG CAPS capsule Take 2 capsules (0.8 mg total) by mouth daily.   valACYclovir (VALTREX) 1000 MG tablet TAKE TWO TABLETS BY MOUTH AT ONSET, THEN TAKE TWO TABLETS 12 HOURS LATER AS DIRECTED     Allergies:   Patient has no known allergies.   Social History   Socioeconomic History   Marital status: Married    Spouse name: Not on file   Number of children: Not on file   Years of education: Not on file   Highest education level: Not on file  Occupational History   Not on file  Tobacco Use   Smoking status: Never   Smokeless tobacco: Never  Substance and Sexual Activity   Alcohol use: Not Currently    Comment: No use in over 20-30 years   Drug use: Never   Sexual activity: Not on file  Other Topics  Concern   Not on file  Social History Narrative   Not on file   Social Determinants of Health   Financial Resource Strain: Low Risk  (02/12/2021)   Overall Financial Resource Strain (CARDIA)    Difficulty of Paying Living Expenses: Not hard at all  Food Insecurity: No Food Insecurity (03/25/2022)   Hunger Vital Sign    Worried About Running Out of Food in the Last Year: Never true    Ran Out of Food in the Last Year: Never true  Transportation Needs: No Transportation Needs (03/25/2022)   PRAPARE - Administrator, Civil Service (Medical): No    Lack of Transportation (Non-Medical): No  Physical Activity: Insufficiently Active (02/12/2021)   Exercise Vital Sign    Days of Exercise per Week: 2 days    Minutes of Exercise per Session: 30 min  Stress: No Stress Concern Present (02/12/2021)   [employer] of Occupational Health - Occupational Stress Questionnaire    Feeling of Stress : Not at all  Social Connections: Socially Integrated (02/12/2021)   Social Connection and Isolation Panel [NHANES]    Frequency of Communication with Friends and Family: More than three times a week    Frequency of Social Gatherings with Friends and Family: More than three times a week    Attends Religious Services: More than 4 times per year    Active Member of [employer] or Organizations: Yes    Attends Engineer, structural: More than 4 times per year    Marital Status: Married     Family History: The patient's family history includes ADD / ADHD in his mother and son; Depression in his mother; Diabetes in his father; Drug abuse in his paternal grandmother; OCD in his daughter and mother; Stroke in his father. ROS:   Please see the history of present illness.    All 14 point review of systems negative except as described per history of present illness.  EKGs/Labs/Other Studies Reviewed:    The following studies were reviewed today:   EKG:  EKG Interpretation Date/Time:  Tuesday  February 03 2023 09:05:46 EDT Ventricular Rate:  71 PR Interval:  170 QRS Duration:  96 QT Interval:  394 QTC Calculation: 428 R Axis:   99  Text Interpretation: Normal sinus rhythm Rightward axis Biventricular hypertrophy No previous ECGs available Confirmed by Huntley Dec reddy [phone]) on 02/03/2023 9:13:23 AM    Recent Labs: 12/23/2022: ALT 16; BUN 17; Creatinine, Ser 1.20; Hemoglobin 12.6; Platelets 265.0; Potassium 4.2; Sodium 139  Recent Lipid Panel  Component Value Date/Time   CHOL 178 10/27/2022 1522   TRIG 73.0 10/27/2022 1522   HDL 63.40 10/27/2022 1522   CHOLHDL 3 10/27/2022 1522   VLDL 14.6 10/27/2022 1522   LDLCALC 100 (H) 10/27/2022 1522    Physical Exam:    VS:  BP 130/66   Pulse 71   Ht 5\' 10"  (1.778 m)   Wt 171 lb (77.6 kg)   SpO2 99%   BMI 24.54 kg/m     Wt Readings from Last 3 Encounters:  02/03/23 171 lb (77.6 kg)  12/23/22 168 lb (76.2 kg)  10/27/22 173 lb 6.4 oz (78.7 kg)     GENERAL:  Well nourished, well developed in no acute distress NECK: No JVD; No carotid bruits CARDIAC: RRR, S1 and S2 present, 4/6 harsh ejection systolic murmur heard all over the precordium. CHEST:  Clear to auscultation without rales, wheezing or rhonchi  Extremities: No pitting pedal edema. Pulses bilaterally symmetric with radial 2+ and dorsalis pedis 2+ NEUROLOGIC:  Alert and oriented x 3  Medication Adjustments/Labs and Tests Ordered: Current medicines are reviewed at length with the patient today.  Concerns regarding medicines are outlined above.  Orders Placed This Encounter  Procedures   CT CORONARY MORPH W/CTA COR W/SCORE W/CA W/CM &/OR WO/CM   Basic metabolic panel   EKG 12-Lead   Meds ordered this encounter  Medications   metoprolol tartrate (LOPRESSOR) 100 MG tablet    Sig: Take 1 tablet (100 mg total) by mouth once for 1 dose. Take 2 hours prior to your CT if your heart rate is greater than 55    Dispense:  1 tablet    Refill:  0     Signed, Tremel Setters reddy Liona Wengert, MD, MPH, [facility]. 02/03/2023 9:50 AM    [facility]

## 2023-02-03 NOTE — Assessment & Plan Note (Signed)
Does not appear significant cardiac related as his symptoms occurred on a dry hot day with limited oral fluid intake.  No symptom recurrence.  He is aware to keep himself well-hydrated.

## 2023-02-03 NOTE — Assessment & Plan Note (Signed)
Reviewed findings as above under aortic gnosis.  Transthoracic echocardiogram ordered.

## 2023-02-04 LAB — BASIC METABOLIC PANEL
BUN/Creatinine Ratio: 23 (ref 10–24)
BUN: 28 mg/dL — ABNORMAL HIGH (ref 8–27)
CO2: 24 mmol/L (ref 20–29)
Calcium: 9.4 mg/dL (ref 8.6–10.2)
Chloride: 104 mmol/L (ref 96–106)
Creatinine, Ser: 1.21 mg/dL (ref 0.76–1.27)
Glucose: 99 mg/dL (ref 70–99)
Potassium: 4.9 mmol/L (ref 3.5–5.2)
Sodium: 142 mmol/L (ref 134–144)
eGFR: 64 mL/min/{1.73_m2} (ref >59)

## 2023-02-06 ENCOUNTER — Ambulatory Visit: Payer: Medicare Other | Admitting: Psychiatry

## 2023-02-18 ENCOUNTER — Encounter: Payer: Self-pay | Admitting: Psychiatry

## 2023-02-26 ENCOUNTER — Ambulatory Visit (HOSPITAL_BASED_OUTPATIENT_CLINIC_OR_DEPARTMENT_OTHER): Payer: Medicare Other

## 2023-03-02 ENCOUNTER — Ambulatory Visit: Payer: BC Managed Care – PPO

## 2023-03-17 ENCOUNTER — Encounter (HOSPITAL_COMMUNITY): Payer: Self-pay

## 2023-03-19 ENCOUNTER — Telehealth: Payer: Self-pay | Admitting: Family Medicine

## 2023-03-19 NOTE — Telephone Encounter (Signed)
Copied from CRM [id number]. Topic: Medicare AWV >> Mar 19, 2023 10:07 AM Payton Doughty wrote: Reason for CRM: Called LVM 03/19/2023 to schedule AWV TELEHEALTH ONLY  Verlee Rossetti; Care Guide Ambulatory Clinical Support [place] [facility] Direct Dial: [phone]

## 2023-05-14 ENCOUNTER — Other Ambulatory Visit: Payer: Self-pay

## 2023-05-14 DIAGNOSIS — I351 Nonrheumatic aortic (valve) insufficiency: Secondary | ICD-10-CM

## 2023-05-14 DIAGNOSIS — R42 Dizziness and giddiness: Secondary | ICD-10-CM

## 2023-05-14 DIAGNOSIS — E785 Hyperlipidemia, unspecified: Secondary | ICD-10-CM

## 2023-05-14 DIAGNOSIS — R0609 Other forms of dyspnea: Secondary | ICD-10-CM

## 2023-05-14 DIAGNOSIS — I352 Nonrheumatic aortic (valve) stenosis with insufficiency: Secondary | ICD-10-CM

## 2023-05-14 DIAGNOSIS — I350 Nonrheumatic aortic (valve) stenosis: Secondary | ICD-10-CM

## 2023-06-01 ENCOUNTER — Telehealth (HOSPITAL_COMMUNITY): Payer: Self-pay | Admitting: *Deleted

## 2023-06-01 ENCOUNTER — Ambulatory Visit (HOSPITAL_BASED_OUTPATIENT_CLINIC_OR_DEPARTMENT_OTHER)
Admission: RE | Admit: 2023-06-01 | Discharge: 2023-06-01 | Disposition: A | Discharge status: Home / Self Care | Payer: Medicare Other | Source: Ambulatory Visit

## 2023-06-01 DIAGNOSIS — I350 Nonrheumatic aortic (valve) stenosis: Secondary | ICD-10-CM | POA: Diagnosis present

## 2023-06-01 DIAGNOSIS — I351 Nonrheumatic aortic (valve) insufficiency: Secondary | ICD-10-CM | POA: Diagnosis not present

## 2023-06-01 LAB — ECHOCARDIOGRAM COMPLETE
AR max vel: 0.88 cm2
AV Area VTI: 0.86 cm2
AV Area mean vel: 0.95 cm2
AV Mean grad: 18.8 mm[Hg]
AV Peak grad: 33.6 mm[Hg]
AV Vena cont: 0.2 cm
Ao pk vel: 2.9 m/s
Area-P 1/2: 2.52 cm2
Calc EF: 63.1 %
P 1/2 time: 1600 ms
S' Lateral: 2.5 cm
Single Plane A2C EF: 64.5 %
Single Plane A4C EF: 62.4 %

## 2023-06-01 NOTE — Telephone Encounter (Signed)
 Reaching out to patient to offer assistance regarding upcoming cardiac imaging study; pt verbalizes understanding of appt date/time, parking situation and where to check in, pre-test NPO status and medications ordered, and verified current allergies; name and call back number provided for further questions should they arise  Larey Brick RN Navigator Cardiac Imaging [facility] [phone] office [phone] cell  Patient to take 100mg  metoprolol tartrate two hours prior to his cardiac CT scan.

## 2023-06-02 ENCOUNTER — Ambulatory Visit (HOSPITAL_BASED_OUTPATIENT_CLINIC_OR_DEPARTMENT_OTHER)
Admission: RE | Admit: 2023-06-02 | Discharge: 2023-06-02 | Disposition: A | Discharge status: Home / Self Care | Payer: Medicare Other | Source: Ambulatory Visit

## 2023-06-02 ENCOUNTER — Encounter (HOSPITAL_BASED_OUTPATIENT_CLINIC_OR_DEPARTMENT_OTHER): Payer: Self-pay

## 2023-06-02 DIAGNOSIS — I700 Atherosclerosis of aorta: Secondary | ICD-10-CM | POA: Diagnosis not present

## 2023-06-02 DIAGNOSIS — I2510 Atherosclerotic heart disease of native coronary artery without angina pectoris: Secondary | ICD-10-CM | POA: Insufficient documentation

## 2023-06-02 DIAGNOSIS — R0609 Other forms of dyspnea: Secondary | ICD-10-CM | POA: Diagnosis present

## 2023-06-02 MED ORDER — IOHEXOL 350 MG/ML SOLN
100.0000 mL | Freq: Once | INTRAVENOUS | Status: AC | PRN
Start: 2023-06-02 — End: 2023-06-02
  Administered 2023-06-02: 95 mL via INTRAVENOUS

## 2023-06-02 MED ORDER — NITROGLYCERIN 0.4 MG SL SUBL
0.8000 mg | SUBLINGUAL_TABLET | Freq: Once | SUBLINGUAL | Status: AC
  Administered 2023-06-02: 0.8 mg via SUBLINGUAL

## 2023-06-04 ENCOUNTER — Other Ambulatory Visit: Payer: Self-pay | Admitting: Cardiology

## 2023-06-04 ENCOUNTER — Ambulatory Visit (HOSPITAL_BASED_OUTPATIENT_CLINIC_OR_DEPARTMENT_OTHER)
Admission: RE | Admit: 2023-06-04 | Discharge: 2023-06-04 | Disposition: A | Discharge status: Home / Self Care | Payer: BC Managed Care – PPO | Source: Ambulatory Visit | Attending: Cardiology | Admitting: Cardiology

## 2023-06-04 DIAGNOSIS — R931 Abnormal findings on diagnostic imaging of heart and coronary circulation: Secondary | ICD-10-CM

## 2023-06-04 DIAGNOSIS — I2510 Atherosclerotic heart disease of native coronary artery without angina pectoris: Secondary | ICD-10-CM

## 2023-06-04 HISTORY — DX: Atherosclerotic heart disease of native coronary artery without angina pectoris: I25.10

## 2023-06-05 ENCOUNTER — Telehealth: Payer: Self-pay

## 2023-06-05 NOTE — Telephone Encounter (Signed)
 Sent to front desk to schedule follow up appt to discuss per Dr. Vincent Gros

## 2023-06-22 NOTE — Telephone Encounter (Signed)
 LVM for pt to call and schedule an appt with RJK in [place] to discuss test results/kbl 06/22/23

## 2023-07-13 ENCOUNTER — Ambulatory Visit

## 2023-07-13 VITALS — BP 136/78 | HR 78 | Ht 70.0 in | Wt 177.1 lb

## 2023-07-13 DIAGNOSIS — I25118 Atherosclerotic heart disease of native coronary artery with other forms of angina pectoris: Secondary | ICD-10-CM | POA: Diagnosis present

## 2023-07-13 DIAGNOSIS — I350 Nonrheumatic aortic (valve) stenosis: Secondary | ICD-10-CM | POA: Diagnosis present

## 2023-07-13 DIAGNOSIS — E785 Hyperlipidemia, unspecified: Secondary | ICD-10-CM | POA: Insufficient documentation

## 2023-07-13 DIAGNOSIS — I351 Nonrheumatic aortic (valve) insufficiency: Secondary | ICD-10-CM | POA: Insufficient documentation

## 2023-07-13 MED ORDER — ROSUVASTATIN CALCIUM 20 MG PO TABS: 20.0000 mg | ORAL_TABLET | Freq: Every day | ORAL | 3 refills | Status: AC

## 2023-07-13 NOTE — Progress Notes (Signed)
 Cardiology Consultation:    Date:  07/13/2023   ID:  Robert Lee, DOB 1950-11-04, MRN [id number]  PCP:  Pearline Cables, MD  Cardiologist:  Marlyn Corporal Harsha Yusko, MD   Referring MD: Pearline Cables, MD   No chief complaint on file.    ASSESSMENT AND PLAN:   Mr Scheaffer 73 year old male with coronary atherosclerosis calcium score 376 and plaque volume 337 with CAD RADS 3 study predominantly involving ostial to proximal LAD with borderline CT FFR lesion 06-02-2023. Also has moderate aortic stenosis, mild aortic insufficiency, mild carotid atherosclerosis, was initially seen in October for symptoms of dyspnea on exertion.  Echocardiogram and subsequently cardiac CT with delayed until February for unclear reasons.  Problem List Items Addressed This Visit     Hyperlipidemia LDL goal <70   Last lipid panel from July 2024 total cholesterol 178, triglycerides 73, HDL 63, LDL 100. Continue rosuvastatin, would titrate up the dose to 20 mg once daily. Repeat fasting lipid panel and CMP to assess liver function test to be ordered and he can get this blood draw at the time of his annual upcoming follow-up visit with his PCP      Relevant Medications   rosuvastatin (CRESTOR) 20 MG tablet   Other Relevant Orders   ECHOCARDIOGRAM COMPLETE   Lipid Profile   Comprehensive metabolic panel with GFR   Moderate aortic stenosis, by TTE 05/2023   Reviewed the findings from the echocardiogram suggestive of moderate aortic stenosis. Clinical findings are consistent with this. Will follow-up closely tentatively in 9 to 10 months with repeat echocardiogram and visit in the office.      Relevant Medications   rosuvastatin (CRESTOR) 20 MG tablet   Other Relevant Orders   ECHOCARDIOGRAM COMPLETE   Lipid Profile   Comprehensive metabolic panel with GFR   Mild aortic insufficiency, TTE 05/2023   Relevant Medications   rosuvastatin (CRESTOR) 20 MG tablet   Other Relevant Orders   ECHOCARDIOGRAM  COMPLETE   Lipid Profile   Comprehensive metabolic panel with GFR   CAD (coronary artery disease), abnormal CT coronary 06/02/2023 LAD moderate stenosis, borderline by CT FFR - Primary   CT coronary imaging 06-02-2023 noted calcium score 376, plaque volume 337 mm cube, CAD RADS 3 study with moderate stenosis of ostial to proximal LAD, further evaluation with CT FFR showed borderline lesion across the LAD [proximal 0.86, mid 0.79, distal 0.72].  No significant extracardiac findings on radiologist overread.  In this context reviewed these results at length. His functional status at this time is excellent having a regular exercise regimen.   Given lack of any significant anginal symptoms we will hold off on further invasive testing or functional testing. Advised him to continue with regular exercise as tolerated. If he does notice any change in his functional capacity such as due to chest pain or shortness of breath with exertion or decreased effort tolerance to notify us immediately.  Advised to continue aspirin 81 mg once daily. Advised to continue rosuvastatin, dose being titrated up to 20 mg once daily.        Relevant Medications   rosuvastatin (CRESTOR) 20 MG tablet   Other Relevant Orders   ECHOCARDIOGRAM COMPLETE   Lipid Profile   Comprehensive metabolic panel with GFR    Return to tentatively in 10 months.  History of Present Illness:    Robert Lee is a 73 y.o. male who is being seen today for follow-up visit. PCP use Copland, Gwenlyn Found, MD.  Last visit with me in the office was 02-03-2023  History of dyslipidemia, mild to moderate aortic stenosis, mild aortic insufficiency, mild carotid atherosclerosis, evidence of coronary atherosclerosis noted on prior CT imaging of the chest in 2019 evaluated for symptoms of dyspnea with moderate to heavy exertion progressive over the past year.  CT coronary imaging 06-02-2023 noted calcium score 376, plaque volume 337 mm cube, CAD  RADS 3 study with moderate stenosis of ostial to proximal LAD, further evaluation with CT FFR showed borderline lesion across the LAD [proximal 0.86, mid 0.79, distal 0.72].  No significant extracardiac findings on radiologist overread.  Echocardiogram done 06-01-2023 noted normal biventricular function LVEF 60 to 65%, mild concentric left ventricular hypertrophy, grade 1 diastolic dysfunction, mild aortic insufficiency, moderate aortic stenosis.  Doppler assessment suboptimal.  Reviewed images myself.  Works actively.  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 Mentions for the last 3 months he has been exercising regularly 3 times a week, using resistance band training.  Also brisk walking 3 times a week.  Feels he has significant improvement in his exercise tolerance.  He no longer feels out of breath walking up an incline on his driveway, in contrast to where he was getting out of breath few months ago.  Denies any symptoms of chest pain or shortness of breath at baseline at this time. We reviewed the findings from the cardiac CT and echocardiogram at length.  Is compliant with the medications including aspirin, statin.  Past Medical History:  Diagnosis Date   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   CAD (coronary artery disease), abnormal CT coronary 06/02/2023 LAD moderate stenosis, borderline by CT FFR 06/04/2023   Carotid stenosis, right 09/21/2015   40-59% right side, left side less than 39% plaque     Depression 11 years ago   Depression, major, in remission (HCC) 09/21/2015   Dyspnea on exertion 02/03/2023   Family history of carotid artery stenosis 08/13/2020   Hyperlipidemia LDL goal <70 09/21/2015   Lightheadedness 02/03/2023   Mild aortic insufficiency, TTE 05/2023 02/03/2023   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  Mild AS and AI on echo.  Ejection fraction   55-60%     Prediabetes 08/13/2020    Past Surgical History:  Procedure Laterality Date   OTHER SURGICAL HISTORY  1968-1969   scull depression surgery    TONSILLECTOMY      Current Medications: Current Meds  Medication Sig   aspirin 81 MG EC tablet Take 81 mg by mouth daily.   buPROPion (WELLBUTRIN XL) 300 MG 24 hr tablet Take 300 mg by mouth daily.   DULoxetine (CYMBALTA) 60 MG capsule Take 1 capsule (60 mg total) by mouth daily.   Omega 3 1200 MG CAPS Take 1,200 mg by mouth daily.   rosuvastatin (CRESTOR) 20 MG tablet Take 1 tablet (20 mg total) by mouth daily.   valACYclovir (VALTREX) 1000 MG tablet TAKE TWO TABLETS BY MOUTH AT ONSET, THEN TAKE TWO TABLETS 12 HOURS LATER AS DIRECTED   [DISCONTINUED] rosuvastatin (CRESTOR) 10 MG tablet Take 1 tablet (10 mg total) by mouth daily.     Allergies:   Patient has no known allergies.   Social History   Socioeconomic History   Marital status: Married    Spouse name: Not on file   Number of children: Not on  file   Years of education: Not on file   Highest education level: Not on file  Occupational History   Not on file  Tobacco Use   Smoking status: Never   Smokeless tobacco: Never  Substance and Sexual Activity   Alcohol use: Not Currently    Comment: No use in over 20-30 years   Drug use: Never   Sexual activity: Not on file  Other Topics Concern   Not on file  Social History Narrative   Not on file   Social Drivers of Health   Financial Resource Strain: Low Risk  (02/12/2021)   Overall Financial Resource Strain (CARDIA)    Difficulty of Paying Living Expenses: Not hard at all  Food Insecurity: No Food Insecurity (03/25/2022)   Hunger Vital Sign    Worried About Running Out of Food in the Last Year: Never true    Ran Out of Food in the Last Year: Never true  Transportation Needs: No Transportation Needs (03/25/2022)   PRAPARE - Therapist, art (Medical): No    Lack of Transportation (Non-Medical): No  Physical Activity: Insufficiently Active (02/12/2021)   Exercise Vital Sign    Days of Exercise per Week: 2 days    Minutes of Exercise per Session: 30 min  Stress: No Stress Concern Present (02/12/2021)   [employer] of Occupational Health - Occupational Stress Questionnaire    Feeling of Stress : Not at all  Social Connections: Socially Integrated (02/12/2021)   Social Connection and Isolation Panel [NHANES]    Frequency of Communication with Friends and Family: More than three times a week    Frequency of Social Gatherings with Friends and Family: More than three times a week    Attends Religious Services: More than 4 times per year    Active Member of [employer] or Organizations: Yes    Attends Engineer, structural: More than 4 times per year    Marital Status: Married     Family History: The patient's family history includes ADD / ADHD in his mother and son; Depression in his mother; Diabetes in his father; Drug abuse in his paternal grandmother; OCD in his daughter and mother; Stroke in his father. ROS:   Please see the history of present illness.    All 14 point review of systems negative except as described per history of present illness.  EKGs/Labs/Other Studies Reviewed:    The following studies were reviewed today:   EKG:       Recent Labs: 12/23/2022: ALT 16 02/03/2023: BUN 28; Creatinine, Ser 1.21; Hemoglobin 12.9; Platelets 246.0; Potassium 4.9; Sodium 142  Recent Lipid Panel    Component Value Date/Time   CHOL 178 10/27/2022 1522   TRIG 73.0 10/27/2022 1522   HDL 63.40 10/27/2022 1522   CHOLHDL 3 10/27/2022 1522   VLDL 14.6 10/27/2022 1522   LDLCALC 100 (H) 10/27/2022 1522    Physical Exam:    VS:  BP 136/78   Pulse 78   Ht 5\' 10"  (1.778 m)   Wt 177 lb 1.9 oz (80.3 kg)   SpO2 97%   BMI 25.41 kg/m     Wt Readings from Last 3 Encounters:  07/13/23 177 lb  1.9 oz (80.3 kg)  02/03/23 171 lb (77.6 kg)  12/23/22 168 lb (76.2 kg)     GENERAL:  Well nourished, well developed in no acute distress CARDIAC: RRR, S1 and S2 present, 4/6 ejection systolic murmur best heard in right upper  sternal border CHEST:  Clear to auscultation without rales, wheezing or rhonchi  Extremities: No pitting pedal edema. Pulses bilaterally symmetric with radial 2+ NEUROLOGIC:  Alert and oriented x 3  Medication Adjustments/Labs and Tests Ordered: Current medicines are reviewed at length with the patient today.  Concerns regarding medicines are outlined above.  Orders Placed This Encounter  Procedures   Lipid Profile   Comprehensive metabolic panel with GFR   ECHOCARDIOGRAM COMPLETE   Meds ordered this encounter  Medications   rosuvastatin (CRESTOR) 20 MG tablet    Sig: Take 1 tablet (20 mg total) by mouth daily.    Dispense:  90 tablet    Refill:  3    Signed, Noelly Lasseigne reddy Kemiyah Tarazon, MD, MPH, [facility]. 07/13/2023 3:55 PM    [facility]

## 2023-07-13 NOTE — Assessment & Plan Note (Signed)
 Reviewed the findings from the echocardiogram suggestive of moderate aortic stenosis. Clinical findings are consistent with this. Will follow-up closely tentatively in 9 to 10 months with repeat echocardiogram and visit in the office.

## 2023-07-13 NOTE — Assessment & Plan Note (Signed)
 CT coronary imaging 06-02-2023 noted calcium score 376, plaque volume 337 mm cube, CAD RADS 3 study with moderate stenosis of ostial to proximal LAD, further evaluation with CT FFR showed borderline lesion across the LAD [proximal 0.86, mid 0.79, distal 0.72].  No significant extracardiac findings on radiologist overread.  In this context reviewed these results at length. His functional status at this time is excellent having a regular exercise regimen.   Given lack of any significant anginal symptoms we will hold off on further invasive testing or functional testing. Advised him to continue with regular exercise as tolerated. If he does notice any change in his functional capacity such as due to chest pain or shortness of breath with exertion or decreased effort tolerance to notify us immediately.  Advised to continue aspirin 81 mg once daily. Advised to continue rosuvastatin, dose being titrated up to 20 mg once daily.

## 2023-07-13 NOTE — Patient Instructions (Signed)
 Medication Instructions:  Your physician has recommended you make the following change in your medication:   START: Crestor 20 mg daily  *If you need a refill on your cardiac medications before your next appointment, please call your pharmacy*  Lab Work: Your physician recommends that you return for lab work in:   Labs with your PCP: Lipid, CMP  If you have labs (blood work) drawn today and your tests are completely normal, you will receive your results only by: MyChart Message (if you have MyChart) OR A paper copy in the mail If you have any lab test that is abnormal or we need to change your treatment, we will call you to review the results.  Testing/Procedures: Your physician has requested that you have an echocardiogram. Echocardiography is a painless test that uses sound waves to create images of your heart. It provides your doctor with information about the size and shape of your heart and how well your heart's chambers and valves are working. This procedure takes approximately one hour. There are no restrictions for this procedure. Please do NOT wear cologne, perfume, aftershave, or lotions (deodorant is allowed). Please arrive 15 minutes prior to your appointment time.  Please note: We ask at that you not bring children with you during ultrasound (echo/ vascular) testing. Due to room size and safety concerns, children are not allowed in the ultrasound rooms during exams. Our front office staff cannot provide observation of children in our lobby area while testing is being conducted. An adult accompanying a patient to their appointment will only be allowed in the ultrasound room at the discretion of the ultrasound technician under special circumstances. We apologize for any inconvenience.   Follow-Up: At [facility], you and your health needs are our priority.  As part of our continuing mission to provide you with exceptional heart care, our providers are all part of one  team.  This team includes your primary Cardiologist (physician) and Advanced Practice Providers or APPs (Physician Assistants and Nurse Practitioners) who all work together to provide you with the care you need, when you need it.  Your next appointment:   10 month(s)  Provider:   Huntley Dec, MD    We recommend signing up for the patient portal called "MyChart".  Sign up information is provided on this After Visit Summary.  MyChart is used to connect with patients for Virtual Visits (Telemedicine).  Patients are able to view lab/test results, encounter notes, upcoming appointments, etc.  Non-urgent messages can be sent to your provider as well.   To learn more about what you can do with MyChart, go to [email or web address].   Other Instructions None

## 2023-07-13 NOTE — Assessment & Plan Note (Signed)
 Last lipid panel from July 2024 total cholesterol 178, triglycerides 73, HDL 63, LDL 100. Continue rosuvastatin, would titrate up the dose to 20 mg once daily. Repeat fasting lipid panel and CMP to assess liver function test to be ordered and he can get this blood draw at the time of his annual upcoming follow-up visit with his PCP

## 2023-11-18 ENCOUNTER — Other Ambulatory Visit: Payer: Self-pay | Admitting: Family Medicine

## 2023-11-18 DIAGNOSIS — F3342 Major depressive disorder, recurrent, in full remission: Secondary | ICD-10-CM

## 2023-11-18 DIAGNOSIS — F419 Anxiety disorder, unspecified: Secondary | ICD-10-CM

## 2023-12-11 ENCOUNTER — Other Ambulatory Visit: Payer: Self-pay | Admitting: Family Medicine

## 2023-12-19 NOTE — Progress Notes (Addendum)
 [facility] [address], [address] [place], [place] [address] [phone]  Date:  12/24/2023   Name:  Robert Lee   DOB:  Sep 01, 1950   MRN:  [id number]  PCP:  Watt Harlene BROCKS, MD    Chief Complaint: No chief complaint on file.   History of Present Illness:  Robert Lee is a 73 y.o. very pleasant male patient who presents with the following:  Pt seen today for a recheck visit Last seen by me July of last year  History of prediabetes, chest pain and CAD on cardiac imaging  Labs one year ago Married to [place]. 2 children and has several grandchildren   He does not use tobacco or alcohol Exercise - yes  Shingrix Flu Covid booster recommended  Colon 2015  Asa 81 Wellbutrin  Cymbalta  Crestor    Discussed the use of AI scribe software for clinical note transcription with the patient, who gave verbal consent to proceed.  History of Present Illness Robert Lee is a 73 year old male who presents for routine follow-up and colonoscopy referral.  He is seeking to update his colonoscopy, as his last one in 2015 was normal with no polyps. He prefers a colonoscopy over Cologuard. His previous colonoscopy was performed by Dr[facility]ickii at [facility].  He experiences difficulty hearing certain voice levels, particularly soft or high-pitched voices, due to hearing loss. He uses hearing aids, which are cleaned and adjusted annually at [facility], and reports no issues with them.  No chest pain, unusual shortness of breath, or digestive issues. He engages in regular exercise, including free weights, Nautilus, and walking, and has decided to become more active this year.  He recently refilled all his medications and does not require any new prescriptions at this time.     Patient Active Problem List   Diagnosis Date Noted   CAD (coronary artery disease), abnormal CT coronary 06/02/2023 LAD moderate stenosis,  borderline by CT FFR 06/04/2023   Dyspnea on exertion 02/03/2023   Lightheadedness 02/03/2023   Moderate aortic stenosis, by TTE 05/2023 02/03/2023   Mild aortic insufficiency, TTE 05/2023 02/03/2023   Depression    Family history of carotid artery stenosis 08/13/2020   Prediabetes 08/13/2020   Mixed conductive and sensorineural hearing loss of both ears 09/29/2019   MR (mitral regurgitation) 11/10/2017   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   Carotid stenosis, right 09/21/2015   Depression, major, in remission (HCC) 09/21/2015   Hyperlipidemia LDL goal <70 09/21/2015    Past Medical History:  Diagnosis Date   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   CAD (coronary artery disease), abnormal CT coronary 06/02/2023 LAD moderate stenosis, borderline by CT FFR 06/04/2023   Carotid stenosis, right 09/21/2015   40-59% right side, left side less than 39% plaque     Depression 11 years ago   Depression, major, in remission (HCC) 09/21/2015   Dyspnea on exertion 02/03/2023   Family history of carotid artery stenosis 08/13/2020   Hyperlipidemia LDL goal <70 09/21/2015   Lightheadedness 02/03/2023   Mild aortic insufficiency, TTE 05/2023 02/03/2023   Mixed conductive and sensorineural hearing loss of both ears 09/29/2019   Moderate aortic stenosis, by TTE 05/2023 02/03/2023   MR (mitral regurgitation) 11/10/2017   Not found on echocardiogram.  Mild AS and AI on echo.  Ejection fraction   55-60%     Prediabetes 08/13/2020    Past Surgical History:  Procedure Laterality Date   OTHER SURGICAL HISTORY  [phone]   scull depression surgery    TONSILLECTOMY      Social History   Tobacco Use   Smoking status: Never   Smokeless tobacco: Never  Substance Use Topics   Alcohol use: Not Currently    Comment: No use in over 20-30 years   Drug use: Never    Family History  Problem Relation Age of Onset   Depression Mother    ADD / ADHD Mother     OCD Mother    Stroke Father    Diabetes Father    Drug abuse Paternal Grandmother    OCD Daughter    ADD / ADHD Son     No Known Allergies  Medication list has been reviewed and updated.  Current Outpatient Medications on File Prior to Visit  Medication Sig Dispense Refill   aspirin 81 MG EC tablet Take 81 mg by mouth daily.     buPROPion  (WELLBUTRIN  XL) 300 MG 24 hr tablet TAKE 1 TABLET DAILY 90 tablet 3   DULoxetine  (CYMBALTA ) 60 MG capsule TAKE 1 CAPSULE DAILY 90 capsule 3   Omega 3 1200 MG CAPS Take 1,200 mg by mouth daily.     rosuvastatin  (CRESTOR ) 20 MG tablet Take 1 tablet (20 mg total) by mouth daily. 90 tablet 3   valACYclovir (VALTREX) 1000 MG tablet TAKE TWO TABLETS BY MOUTH AT ONSET, THEN TAKE TWO TABLETS 12 HOURS LATER AS DIRECTED     No current facility-administered medications on file prior to visit.    Review of Systems:  As per HPI- otherwise negative.   Physical Examination: Vitals:   12/24/23 1400  BP: 128/64  Pulse: 67   Vitals:   12/24/23 1400  Weight: 175 lb 6.4 oz (79.6 kg)  Height: 5' 10 (1.778 m)   Body mass index is 25.17 kg/m. Ideal Body Weight: Weight in (lb) to have BMI = 25: 173.9  GEN: no acute distress. Normal weight, looks well  HEENT: Atraumatic, Normocephalic.  Bilateral TM wnl, oropharynx normal.  PEERL,EOMI.   Ears and Nose: No external deformity. CV: RRR, No M/G/R. No JVD. No thrill. No extra heart sounds. PULM: CTA B, no wheezes, crackles, rhonchi. No retractions. No resp. distress. No accessory muscle use. ABD: S, NT, ND, +BS. No rebound. No HSM. EXTR: No c/c/e PSYCH: Normally interactive. Conversant.    Assessment and Plan: Physical exam  Screening for deficiency anemia - Plan: CBC  Prediabetes - Plan: Comprehensive metabolic panel with GFR, Hemoglobin A1c  Screening, lipid - Plan: Lipid panel  Special screening, prostate cancer - Plan: PSA, Medicare ( [facility] only)  Screening for colon cancer -  Plan: Ambulatory referral to Gastroenterology  Medication monitoring encounter - Plan: CBC  Assessment & Plan Adult Wellness Visit Routine wellness visit with no acute concerns. Regular exercise routine noted. - Order CBC, metabolic profile, A1c, cholesterol, PSA. - Refer to Dr. Vickii for colonoscopy. Discussed potential for this to be the last colonoscopy depending on findings and age. - Discussed flu vaccination; he is undecided.  Hearing loss Hearing loss with soft or high-pitched voices. No changes in hearing aids reported. - Annual maintenance of hearing aids at [facility].  Signed Harlene Schroeder, MD  Addendum 9/19, received labs as below.  Message to patient  Results for orders placed or performed in visit on 12/24/23  CBC   Collection Time: 12/24/23  2:21 PM  Result Value Ref Range   WBC 4.8 4.0 -  10.5 K/uL   RBC 4.37 4.22 - 5.81 Mil/uL   Platelets 256.0 150.0 - 400.0 K/uL   Hemoglobin 12.9 (L) 13.0 - 17.0 g/dL   HCT 60.5 60.9 - 47.9 %   MCV 90.1 78.0 - 100.0 fl   MCHC 32.8 30.0 - 36.0 g/dL   RDW 86.2 88.4 - 84.4 %  Comprehensive metabolic panel with GFR   Collection Time: 12/24/23  2:21 PM  Result Value Ref Range   Sodium 139 135 - 145 mEq/L   Potassium 4.4 3.5 - 5.1 mEq/L   Chloride 103 96 - 112 mEq/L   CO2 28 19 - 32 mEq/L   Glucose, Bld 93 70 - 99 mg/dL   BUN 24 (H) 6 - 23 mg/dL   Creatinine, Ser 8.37 (H) 0.40 - 1.50 mg/dL   Total Bilirubin 0.6 0.2 - 1.2 mg/dL   Alkaline Phosphatase 62 39 - 117 U/L   AST 18 0 - 37 U/L   ALT 16 0 - 53 U/L   Total Protein 6.5 6.0 - 8.3 g/dL   Albumin 4.4 3.5 - 5.2 g/dL   GFR 58.21 (L) >39.99 mL/min   Calcium  9.4 8.4 - 10.5 mg/dL  Hemoglobin J8r   Collection Time: 12/24/23  2:21 PM  Result Value Ref Range   Hgb A1c MFr Bld 6.6 (H) 4.6 - 6.5 %  Lipid panel   Collection Time: 12/24/23  2:21 PM  Result Value Ref Range   Cholesterol 185 0 - 200 mg/dL   Triglycerides 22.9 0.0 - 149.0 mg/dL   HDL 36.29 >60.99 mg/dL   VLDL  84.5 0.0 - 59.9 mg/dL   LDL Cholesterol 894 (H) 0 - 99 mg/dL   Total CHOL/HDL Ratio 3    NonHDL 120.86   PSA, Medicare ( [facility] only)   Collection Time: 12/24/23  2:21 PM  Result Value Ref Range   PSA 3.25 0.10 - 4.00 ng/ml

## 2023-12-19 NOTE — Patient Instructions (Addendum)
 Good to see you again today I will be in touch with your labs  Recommend a flu shot this fall if not done already Recommend shingrix series  Recommend a covid booster this fall   Referral sent to GI for a screening colonoscopy  Shanna Denmark, MD - Gastroenterology - [facility] Gastroenterology - [facility] [address] [place], [place] [address]

## 2023-12-24 ENCOUNTER — Encounter: Payer: Self-pay | Admitting: Family Medicine

## 2023-12-24 ENCOUNTER — Ambulatory Visit (INDEPENDENT_AMBULATORY_CARE_PROVIDER_SITE_OTHER): Admitting: Family Medicine

## 2023-12-24 ENCOUNTER — Telehealth: Payer: Self-pay

## 2023-12-24 VITALS — BP 128/64 | HR 67 | Ht 70.0 in | Wt 175.4 lb

## 2023-12-24 DIAGNOSIS — Z13220 Encounter for screening for lipoid disorders: Secondary | ICD-10-CM | POA: Diagnosis not present

## 2023-12-24 DIAGNOSIS — Z0000 Encounter for general adult medical examination without abnormal findings: Secondary | ICD-10-CM

## 2023-12-24 DIAGNOSIS — Z130 Encounter for screening for diseases of the blood and blood-forming organs and certain disorders involving the immune mechanism: Secondary | ICD-10-CM

## 2023-12-24 DIAGNOSIS — R7303 Prediabetes: Secondary | ICD-10-CM | POA: Diagnosis not present

## 2023-12-24 DIAGNOSIS — Z5181 Encounter for therapeutic drug level monitoring: Secondary | ICD-10-CM

## 2023-12-24 DIAGNOSIS — Z125 Encounter for screening for malignant neoplasm of prostate: Secondary | ICD-10-CM

## 2023-12-24 DIAGNOSIS — Z1211 Encounter for screening for malignant neoplasm of colon: Secondary | ICD-10-CM

## 2023-12-24 NOTE — Telephone Encounter (Signed)
 Copied from CRM [id number]. Topic: General - Other >> Dec 24, 2023  2:34 PM Robert Lee wrote: Reason for CRM: Pt is requesting to speak with Dr.Copland and would like a callback whenever she is available.

## 2023-12-24 NOTE — Telephone Encounter (Signed)
 I called patient back, all is well

## 2023-12-25 ENCOUNTER — Encounter: Payer: Self-pay | Admitting: Family Medicine

## 2023-12-25 LAB — COMPREHENSIVE METABOLIC PANEL WITH GFR
ALT: 16 U/L (ref 0–53)
AST: 18 U/L (ref 0–37)
Albumin: 4.4 g/dL (ref 3.5–5.2)
Alkaline Phosphatase: 62 U/L (ref 39–117)
BUN: 24 mg/dL — ABNORMAL HIGH (ref 6–23)
CO2: 28 meq/L (ref 19–32)
Calcium: 9.4 mg/dL (ref 8.4–10.5)
Chloride: 103 meq/L (ref 96–112)
Creatinine, Ser: 1.62 mg/dL — ABNORMAL HIGH (ref 0.40–1.50)
GFR: 41.78 mL/min — ABNORMAL LOW (ref >60.00)
Glucose, Bld: 93 mg/dL (ref 70–99)
Potassium: 4.4 meq/L (ref 3.5–5.1)
Sodium: 139 meq/L (ref 135–145)
Total Bilirubin: 0.6 mg/dL (ref 0.2–1.2)
Total Protein: 6.5 g/dL (ref 6.0–8.3)

## 2023-12-25 LAB — CBC
HCT: 39.4 % (ref 39.0–52.0)
Hemoglobin: 12.9 g/dL — ABNORMAL LOW (ref 13.0–17.0)
MCHC: 32.8 g/dL (ref 30.0–36.0)
MCV: 90.1 fl (ref 78.0–100.0)
Platelets: 256 K/uL (ref 150.0–400.0)
RBC: 4.37 Mil/uL (ref 4.22–5.81)
RDW: 13.7 % (ref 11.5–15.5)
WBC: 4.8 K/uL (ref 4.0–10.5)

## 2023-12-25 LAB — LIPID PANEL
Cholesterol: 185 mg/dL (ref 0–200)
HDL: 63.7 mg/dL (ref >39.00)
LDL Cholesterol: 105 mg/dL — ABNORMAL HIGH (ref 0–99)
NonHDL: 120.86
Total CHOL/HDL Ratio: 3
Triglycerides: 77 mg/dL (ref 0.0–149.0)
VLDL: 15.4 mg/dL (ref 0.0–40.0)

## 2023-12-25 LAB — PSA, MEDICARE: PSA: 3.25 ng/mL (ref 0.10–4.00)

## 2023-12-25 LAB — HEMOGLOBIN A1C: Hgb A1c MFr Bld: 6.6 % — ABNORMAL HIGH (ref 4.6–6.5)

## 2023-12-25 NOTE — Addendum Note (Signed)
 Addended by: WATT RAISIN C on: 12/25/2023 01:08 PM   Modules accepted: Orders

## 2024-04-11 ENCOUNTER — Telehealth: Payer: Self-pay | Admitting: Family Medicine

## 2024-04-11 NOTE — Telephone Encounter (Signed)
 Copied from CRM [id number]. Topic: Medicare AWV >> Apr 11, 2024 11:28 AM Nathanel DEL wrote: Called LVM 04/11/2024 to sched AWVS. Please schedule AWVS in office.  Nathanel Paschal; Care Guide Ambulatory Clinical Support [place] [facility] Direct Dial: [phone]

## 2024-04-12 DIAGNOSIS — I350 Nonrheumatic aortic (valve) stenosis: Secondary | ICD-10-CM

## 2024-04-12 DIAGNOSIS — I351 Nonrheumatic aortic (valve) insufficiency: Secondary | ICD-10-CM

## 2024-04-12 DIAGNOSIS — E785 Hyperlipidemia, unspecified: Secondary | ICD-10-CM

## 2024-04-12 DIAGNOSIS — I25118 Atherosclerotic heart disease of native coronary artery with other forms of angina pectoris: Secondary | ICD-10-CM

## 2024-05-10 ENCOUNTER — Ambulatory Visit (HOSPITAL_BASED_OUTPATIENT_CLINIC_OR_DEPARTMENT_OTHER)

## 2024-05-10 DIAGNOSIS — I350 Nonrheumatic aortic (valve) stenosis: Secondary | ICD-10-CM | POA: Diagnosis not present

## 2024-05-10 DIAGNOSIS — I25118 Atherosclerotic heart disease of native coronary artery with other forms of angina pectoris: Secondary | ICD-10-CM

## 2024-05-10 DIAGNOSIS — I351 Nonrheumatic aortic (valve) insufficiency: Secondary | ICD-10-CM

## 2024-05-10 LAB — ECHOCARDIOGRAM COMPLETE
AR max vel: 0.88 cm2
AV Area VTI: 0.9 cm2
AV Area mean vel: 0.83 cm2
AV Mean grad: 19.8 mmHg
AV Peak grad: 34.6 mmHg
Ao pk vel: 2.94 m/s
Area-P 1/2: 2.5 cm2
Calc EF: 56.6 %
MV M vel: 1.68 m/s
MV Peak grad: 11.3 mmHg
P 1/2 time: 155 ms
S' Lateral: 2.4 cm
Single Plane A2C EF: 56.8 %
Single Plane A4C EF: 56 %
